# Patient Record
Sex: Male | Born: 1945 | Race: White | Hispanic: No | Marital: Married | Smoking: Former smoker
Health system: Southern US, Community
[De-identification: ages and names within clinical notes are randomized; demographics above are authoritative.]

## PROBLEM LIST (undated history)

## (undated) DIAGNOSIS — E7800 Pure hypercholesterolemia, unspecified: Secondary | ICD-10-CM

## (undated) DIAGNOSIS — R519 Headache, unspecified: Secondary | ICD-10-CM

## (undated) DIAGNOSIS — I10 Essential (primary) hypertension: Secondary | ICD-10-CM

## (undated) DIAGNOSIS — R0600 Dyspnea, unspecified: Secondary | ICD-10-CM

## (undated) DIAGNOSIS — M109 Gout, unspecified: Secondary | ICD-10-CM

## (undated) DIAGNOSIS — M1990 Unspecified osteoarthritis, unspecified site: Secondary | ICD-10-CM

## (undated) DIAGNOSIS — C4490 Unspecified malignant neoplasm of skin, unspecified: Secondary | ICD-10-CM

## (undated) DIAGNOSIS — T753XXA Motion sickness, initial encounter: Secondary | ICD-10-CM

## (undated) DIAGNOSIS — I509 Heart failure, unspecified: Secondary | ICD-10-CM

## (undated) DIAGNOSIS — H9190 Unspecified hearing loss, unspecified ear: Secondary | ICD-10-CM

## (undated) DIAGNOSIS — F419 Anxiety disorder, unspecified: Secondary | ICD-10-CM

## (undated) DIAGNOSIS — I499 Cardiac arrhythmia, unspecified: Secondary | ICD-10-CM

## (undated) DIAGNOSIS — E119 Type 2 diabetes mellitus without complications: Secondary | ICD-10-CM

## (undated) DIAGNOSIS — Z87442 Personal history of urinary calculi: Secondary | ICD-10-CM

## (undated) HISTORY — PX: SKIN CANCER EXCISION: SHX779

## (undated) HISTORY — PX: CARDIAC CATHETERIZATION: SHX172

## (undated) HISTORY — PX: AMPUTATION FINGER / THUMB: SUR24

---

## 2005-02-08 ENCOUNTER — Ambulatory Visit: Payer: Self-pay | Admitting: Pain Medicine

## 2008-05-09 ENCOUNTER — Ambulatory Visit: Payer: Self-pay | Admitting: Internal Medicine

## 2014-06-20 DIAGNOSIS — I10 Essential (primary) hypertension: Secondary | ICD-10-CM | POA: Diagnosis not present

## 2014-06-20 DIAGNOSIS — M1A09X0 Idiopathic chronic gout, multiple sites, without tophus (tophi): Secondary | ICD-10-CM | POA: Diagnosis not present

## 2014-06-20 DIAGNOSIS — M1990 Unspecified osteoarthritis, unspecified site: Secondary | ICD-10-CM | POA: Diagnosis not present

## 2014-06-20 DIAGNOSIS — E119 Type 2 diabetes mellitus without complications: Secondary | ICD-10-CM | POA: Diagnosis not present

## 2014-06-20 DIAGNOSIS — E780 Pure hypercholesterolemia: Secondary | ICD-10-CM | POA: Diagnosis not present

## 2014-06-29 DIAGNOSIS — D696 Thrombocytopenia, unspecified: Secondary | ICD-10-CM | POA: Diagnosis not present

## 2014-06-29 DIAGNOSIS — R748 Abnormal levels of other serum enzymes: Secondary | ICD-10-CM | POA: Diagnosis not present

## 2014-06-29 DIAGNOSIS — R945 Abnormal results of liver function studies: Secondary | ICD-10-CM | POA: Diagnosis not present

## 2014-07-12 ENCOUNTER — Ambulatory Visit: Payer: Self-pay | Admitting: Family Medicine

## 2014-07-12 DIAGNOSIS — J90 Pleural effusion, not elsewhere classified: Secondary | ICD-10-CM | POA: Diagnosis not present

## 2014-07-12 DIAGNOSIS — R188 Other ascites: Secondary | ICD-10-CM | POA: Diagnosis not present

## 2014-07-12 DIAGNOSIS — R748 Abnormal levels of other serum enzymes: Secondary | ICD-10-CM | POA: Diagnosis not present

## 2014-07-12 DIAGNOSIS — R945 Abnormal results of liver function studies: Secondary | ICD-10-CM | POA: Diagnosis not present

## 2014-07-13 ENCOUNTER — Ambulatory Visit
Admit: 2014-07-13 | Disposition: A | Discharge status: Home / Self Care | Payer: Self-pay | Attending: Hematology and Oncology | Admitting: Hematology and Oncology

## 2014-07-13 DIAGNOSIS — Z79899 Other long term (current) drug therapy: Secondary | ICD-10-CM | POA: Diagnosis not present

## 2014-07-13 DIAGNOSIS — Z720 Tobacco use: Secondary | ICD-10-CM | POA: Diagnosis not present

## 2014-07-13 DIAGNOSIS — Z114 Encounter for screening for human immunodeficiency virus [HIV]: Secondary | ICD-10-CM | POA: Diagnosis not present

## 2014-07-13 DIAGNOSIS — I10 Essential (primary) hypertension: Secondary | ICD-10-CM | POA: Diagnosis not present

## 2014-07-13 DIAGNOSIS — E780 Pure hypercholesterolemia: Secondary | ICD-10-CM | POA: Diagnosis not present

## 2014-07-13 DIAGNOSIS — N529 Male erectile dysfunction, unspecified: Secondary | ICD-10-CM | POA: Diagnosis not present

## 2014-07-13 DIAGNOSIS — M109 Gout, unspecified: Secondary | ICD-10-CM | POA: Diagnosis not present

## 2014-07-13 DIAGNOSIS — E119 Type 2 diabetes mellitus without complications: Secondary | ICD-10-CM | POA: Diagnosis not present

## 2014-07-13 DIAGNOSIS — R7989 Other specified abnormal findings of blood chemistry: Secondary | ICD-10-CM | POA: Diagnosis not present

## 2014-07-13 DIAGNOSIS — D6959 Other secondary thrombocytopenia: Secondary | ICD-10-CM | POA: Diagnosis not present

## 2014-07-15 ENCOUNTER — Ambulatory Visit: Payer: Self-pay | Admitting: Family Medicine

## 2014-07-15 DIAGNOSIS — J90 Pleural effusion, not elsewhere classified: Secondary | ICD-10-CM | POA: Diagnosis not present

## 2014-07-15 DIAGNOSIS — I81 Portal vein thrombosis: Secondary | ICD-10-CM | POA: Diagnosis not present

## 2014-07-15 DIAGNOSIS — R188 Other ascites: Secondary | ICD-10-CM | POA: Diagnosis not present

## 2014-07-15 DIAGNOSIS — R601 Generalized edema: Secondary | ICD-10-CM | POA: Diagnosis not present

## 2014-07-15 DIAGNOSIS — I517 Cardiomegaly: Secondary | ICD-10-CM | POA: Diagnosis not present

## 2014-07-15 DIAGNOSIS — I82890 Acute embolism and thrombosis of other specified veins: Secondary | ICD-10-CM | POA: Diagnosis not present

## 2014-07-26 DIAGNOSIS — E785 Hyperlipidemia, unspecified: Secondary | ICD-10-CM | POA: Diagnosis not present

## 2014-07-26 DIAGNOSIS — I10 Essential (primary) hypertension: Secondary | ICD-10-CM | POA: Diagnosis not present

## 2014-07-26 DIAGNOSIS — E119 Type 2 diabetes mellitus without complications: Secondary | ICD-10-CM | POA: Diagnosis not present

## 2014-07-27 DIAGNOSIS — I10 Essential (primary) hypertension: Secondary | ICD-10-CM | POA: Diagnosis not present

## 2014-07-27 DIAGNOSIS — E780 Pure hypercholesterolemia: Secondary | ICD-10-CM | POA: Diagnosis not present

## 2014-07-27 DIAGNOSIS — R0602 Shortness of breath: Secondary | ICD-10-CM | POA: Diagnosis not present

## 2014-07-27 DIAGNOSIS — E119 Type 2 diabetes mellitus without complications: Secondary | ICD-10-CM | POA: Diagnosis not present

## 2014-07-28 DIAGNOSIS — R188 Other ascites: Secondary | ICD-10-CM | POA: Diagnosis not present

## 2014-07-28 DIAGNOSIS — K766 Portal hypertension: Secondary | ICD-10-CM | POA: Diagnosis not present

## 2014-07-28 DIAGNOSIS — J948 Other specified pleural conditions: Secondary | ICD-10-CM | POA: Diagnosis not present

## 2014-07-28 DIAGNOSIS — J90 Pleural effusion, not elsewhere classified: Secondary | ICD-10-CM | POA: Diagnosis not present

## 2014-08-01 DIAGNOSIS — I5021 Acute systolic (congestive) heart failure: Secondary | ICD-10-CM | POA: Diagnosis not present

## 2014-08-01 DIAGNOSIS — R188 Other ascites: Secondary | ICD-10-CM | POA: Diagnosis not present

## 2014-08-01 DIAGNOSIS — K7460 Unspecified cirrhosis of liver: Secondary | ICD-10-CM | POA: Diagnosis not present

## 2014-08-01 DIAGNOSIS — K766 Portal hypertension: Secondary | ICD-10-CM | POA: Diagnosis not present

## 2014-08-01 DIAGNOSIS — I480 Paroxysmal atrial fibrillation: Secondary | ICD-10-CM | POA: Diagnosis not present

## 2014-08-02 DIAGNOSIS — R0602 Shortness of breath: Secondary | ICD-10-CM | POA: Diagnosis not present

## 2014-08-02 DIAGNOSIS — I5021 Acute systolic (congestive) heart failure: Secondary | ICD-10-CM | POA: Diagnosis not present

## 2014-08-02 DIAGNOSIS — I10 Essential (primary) hypertension: Secondary | ICD-10-CM | POA: Diagnosis not present

## 2014-08-02 DIAGNOSIS — I4891 Unspecified atrial fibrillation: Secondary | ICD-10-CM | POA: Diagnosis not present

## 2014-08-02 DIAGNOSIS — I480 Paroxysmal atrial fibrillation: Secondary | ICD-10-CM | POA: Diagnosis not present

## 2014-08-04 DIAGNOSIS — I480 Paroxysmal atrial fibrillation: Secondary | ICD-10-CM | POA: Diagnosis not present

## 2014-08-04 DIAGNOSIS — E782 Mixed hyperlipidemia: Secondary | ICD-10-CM | POA: Diagnosis not present

## 2014-08-04 DIAGNOSIS — I10 Essential (primary) hypertension: Secondary | ICD-10-CM | POA: Diagnosis not present

## 2014-08-04 DIAGNOSIS — N189 Chronic kidney disease, unspecified: Secondary | ICD-10-CM | POA: Diagnosis not present

## 2014-08-04 DIAGNOSIS — R600 Localized edema: Secondary | ICD-10-CM | POA: Diagnosis not present

## 2014-08-08 DIAGNOSIS — I4892 Unspecified atrial flutter: Secondary | ICD-10-CM | POA: Diagnosis not present

## 2014-08-08 DIAGNOSIS — I480 Paroxysmal atrial fibrillation: Secondary | ICD-10-CM | POA: Diagnosis not present

## 2014-08-08 DIAGNOSIS — K766 Portal hypertension: Secondary | ICD-10-CM | POA: Diagnosis not present

## 2014-08-08 DIAGNOSIS — I10 Essential (primary) hypertension: Secondary | ICD-10-CM | POA: Diagnosis not present

## 2014-08-08 DIAGNOSIS — I5021 Acute systolic (congestive) heart failure: Secondary | ICD-10-CM | POA: Diagnosis not present

## 2014-08-10 DIAGNOSIS — K766 Portal hypertension: Secondary | ICD-10-CM | POA: Diagnosis not present

## 2014-08-10 DIAGNOSIS — I5021 Acute systolic (congestive) heart failure: Secondary | ICD-10-CM | POA: Diagnosis not present

## 2014-08-10 DIAGNOSIS — F418 Other specified anxiety disorders: Secondary | ICD-10-CM | POA: Diagnosis not present

## 2014-08-10 DIAGNOSIS — E119 Type 2 diabetes mellitus without complications: Secondary | ICD-10-CM | POA: Diagnosis not present

## 2014-08-12 ENCOUNTER — Ambulatory Visit
Admit: 2014-08-12 | Disposition: A | Discharge status: Home / Self Care | Payer: Self-pay | Attending: Hematology and Oncology | Admitting: Hematology and Oncology

## 2014-08-17 DIAGNOSIS — R600 Localized edema: Secondary | ICD-10-CM | POA: Diagnosis not present

## 2014-08-17 DIAGNOSIS — N189 Chronic kidney disease, unspecified: Secondary | ICD-10-CM | POA: Diagnosis not present

## 2014-08-17 DIAGNOSIS — I480 Paroxysmal atrial fibrillation: Secondary | ICD-10-CM | POA: Diagnosis not present

## 2014-08-17 DIAGNOSIS — I4892 Unspecified atrial flutter: Secondary | ICD-10-CM | POA: Diagnosis not present

## 2014-08-17 DIAGNOSIS — I5021 Acute systolic (congestive) heart failure: Secondary | ICD-10-CM | POA: Diagnosis not present

## 2014-08-23 ENCOUNTER — Ambulatory Visit
Admit: 2014-08-23 | Disposition: A | Discharge status: Home / Self Care | Payer: Self-pay | Attending: Family Medicine | Admitting: Family Medicine

## 2014-08-23 DIAGNOSIS — E119 Type 2 diabetes mellitus without complications: Secondary | ICD-10-CM | POA: Diagnosis not present

## 2014-08-25 DIAGNOSIS — I5021 Acute systolic (congestive) heart failure: Secondary | ICD-10-CM | POA: Diagnosis not present

## 2014-08-25 DIAGNOSIS — I4892 Unspecified atrial flutter: Secondary | ICD-10-CM | POA: Diagnosis not present

## 2014-08-25 DIAGNOSIS — I10 Essential (primary) hypertension: Secondary | ICD-10-CM | POA: Diagnosis not present

## 2014-08-29 ENCOUNTER — Ambulatory Visit
Admit: 2014-08-29 | Disposition: A | Discharge status: Home / Self Care | Payer: Self-pay | Attending: Internal Medicine | Admitting: Internal Medicine

## 2014-08-29 DIAGNOSIS — E119 Type 2 diabetes mellitus without complications: Secondary | ICD-10-CM | POA: Diagnosis not present

## 2014-08-29 DIAGNOSIS — I509 Heart failure, unspecified: Secondary | ICD-10-CM | POA: Diagnosis not present

## 2014-08-29 DIAGNOSIS — Z79899 Other long term (current) drug therapy: Secondary | ICD-10-CM | POA: Diagnosis not present

## 2014-08-29 DIAGNOSIS — M109 Gout, unspecified: Secondary | ICD-10-CM | POA: Diagnosis not present

## 2014-08-29 DIAGNOSIS — I10 Essential (primary) hypertension: Secondary | ICD-10-CM | POA: Diagnosis not present

## 2014-08-29 DIAGNOSIS — Z87891 Personal history of nicotine dependence: Secondary | ICD-10-CM | POA: Diagnosis not present

## 2014-08-29 DIAGNOSIS — I420 Dilated cardiomyopathy: Secondary | ICD-10-CM | POA: Diagnosis not present

## 2014-08-29 DIAGNOSIS — I272 Other secondary pulmonary hypertension: Secondary | ICD-10-CM | POA: Diagnosis not present

## 2014-08-29 DIAGNOSIS — I2510 Atherosclerotic heart disease of native coronary artery without angina pectoris: Secondary | ICD-10-CM | POA: Diagnosis not present

## 2014-08-29 DIAGNOSIS — I5021 Acute systolic (congestive) heart failure: Secondary | ICD-10-CM | POA: Diagnosis not present

## 2014-08-29 DIAGNOSIS — I4892 Unspecified atrial flutter: Secondary | ICD-10-CM | POA: Diagnosis not present

## 2014-09-06 DIAGNOSIS — K766 Portal hypertension: Secondary | ICD-10-CM | POA: Diagnosis not present

## 2014-09-06 DIAGNOSIS — E782 Mixed hyperlipidemia: Secondary | ICD-10-CM | POA: Diagnosis not present

## 2014-09-06 DIAGNOSIS — I5021 Acute systolic (congestive) heart failure: Secondary | ICD-10-CM | POA: Diagnosis not present

## 2014-09-14 DIAGNOSIS — I5021 Acute systolic (congestive) heart failure: Secondary | ICD-10-CM | POA: Diagnosis not present

## 2014-09-14 DIAGNOSIS — K766 Portal hypertension: Secondary | ICD-10-CM | POA: Diagnosis not present

## 2014-09-14 DIAGNOSIS — K7460 Unspecified cirrhosis of liver: Secondary | ICD-10-CM | POA: Diagnosis not present

## 2014-09-14 DIAGNOSIS — F418 Other specified anxiety disorders: Secondary | ICD-10-CM | POA: Diagnosis not present

## 2014-09-15 DIAGNOSIS — I5043 Acute on chronic combined systolic (congestive) and diastolic (congestive) heart failure: Secondary | ICD-10-CM | POA: Diagnosis not present

## 2014-09-15 DIAGNOSIS — I517 Cardiomegaly: Secondary | ICD-10-CM | POA: Diagnosis not present

## 2014-09-15 DIAGNOSIS — J90 Pleural effusion, not elsewhere classified: Secondary | ICD-10-CM | POA: Diagnosis not present

## 2014-09-15 DIAGNOSIS — R188 Other ascites: Secondary | ICD-10-CM | POA: Diagnosis not present

## 2014-09-15 DIAGNOSIS — I480 Paroxysmal atrial fibrillation: Secondary | ICD-10-CM | POA: Diagnosis not present

## 2014-09-20 DIAGNOSIS — I4891 Unspecified atrial fibrillation: Secondary | ICD-10-CM | POA: Diagnosis not present

## 2014-09-20 DIAGNOSIS — I4892 Unspecified atrial flutter: Secondary | ICD-10-CM | POA: Diagnosis not present

## 2014-09-22 DIAGNOSIS — I5022 Chronic systolic (congestive) heart failure: Secondary | ICD-10-CM | POA: Diagnosis not present

## 2014-09-22 DIAGNOSIS — I480 Paroxysmal atrial fibrillation: Secondary | ICD-10-CM | POA: Diagnosis not present

## 2014-09-22 DIAGNOSIS — I10 Essential (primary) hypertension: Secondary | ICD-10-CM | POA: Diagnosis not present

## 2014-09-22 DIAGNOSIS — I4892 Unspecified atrial flutter: Secondary | ICD-10-CM | POA: Diagnosis not present

## 2014-09-28 ENCOUNTER — Telehealth: Payer: Self-pay | Admitting: *Deleted

## 2014-09-28 NOTE — Telephone Encounter (Signed)
Phone call to follow up with patient. He was not feeling well during initial visit and was scheduled for several procedures for CHF. Pt reports he is doing a little better and his blood sugar was 98 mg/dL this morning. Informed him that he could return for classes or 1:1 appt in the future if he is able to attend. Also informed him that he could call for any questions.

## 2014-09-29 ENCOUNTER — Encounter: Payer: Self-pay | Admitting: Emergency Medicine

## 2014-09-29 ENCOUNTER — Ambulatory Visit
Admission: EM | Admit: 2014-09-29 | Discharge: 2014-09-29 | Discharge status: Against Medical Advice | Payer: Medicare Other | Attending: Family Medicine | Admitting: Family Medicine

## 2014-09-29 DIAGNOSIS — R000 Tachycardia, unspecified: Secondary | ICD-10-CM

## 2014-09-29 DIAGNOSIS — W19XXXA Unspecified fall, initial encounter: Secondary | ICD-10-CM

## 2014-09-29 DIAGNOSIS — S0181XA Laceration without foreign body of other part of head, initial encounter: Secondary | ICD-10-CM | POA: Diagnosis not present

## 2014-09-29 DIAGNOSIS — I959 Hypotension, unspecified: Secondary | ICD-10-CM | POA: Diagnosis not present

## 2014-09-29 HISTORY — DX: Heart failure, unspecified: I50.9

## 2014-09-29 HISTORY — DX: Type 2 diabetes mellitus without complications: E11.9

## 2014-09-29 HISTORY — DX: Essential (primary) hypertension: I10

## 2014-09-29 NOTE — ED Provider Notes (Signed)
CSN: [id number]     Arrival date & time 09/29/14  [id number] History   First MD Initiated Contact with Patient 09/29/14 [phone]     Chief Complaint  Patient presents with  . Laceration   (Consider location/radiation/quality/duration/timing/severity/associated sxs/prior Treatment) HPI         69 year old male presents for evaluation after a fall. He fell 2 days ago. He was standing when he bent over to try to pick up his cell phone and fell. He thinks he probably hit his face on something but he is not sure. He did not lose consciousness. Now has some pain on his face beside his left eye. No headache or pain with eye movements. He has a history of heart failure and diabetes, he does not know any of the medications he is on besides benazepril. He does not know if he is on a blood thinner. He denies any chest pain, shortness of breath, NVD, abdominal pain. He has leg swelling bilaterally, he says that is improving with whatever medication his cardiologist gave him.  Past Medical History  Diagnosis Date  . CHF (congestive heart failure)   . Hypertension   . Diabetes mellitus without complication    No past surgical history on file. No family history on file. History  Substance Use Topics  . Smoking status: Former Research scientist (life sciences)  . Smokeless tobacco: Never Used  . Alcohol Use: No    Review of Systems  Constitutional: Negative for fever, chills and fatigue.  HENT: Negative for sore throat.   Eyes: Negative for visual disturbance.  Respiratory: Negative for cough and shortness of breath.   Cardiovascular: Positive for leg swelling. Negative for chest pain and palpitations.  Gastrointestinal: Negative for nausea, vomiting, abdominal pain, diarrhea and constipation.  Genitourinary: Negative for dysuria, urgency, frequency and hematuria.  Musculoskeletal: Negative for myalgias, arthralgias, neck pain and neck stiffness.  Skin: Positive for wound. Negative for rash.  Neurological: Negative for dizziness,  weakness and light-headedness.  All other systems reviewed and are negative.   Allergies  Review of patient's allergies indicates no known allergies.  Home Medications   Prior to Admission medications   Medication Sig Start Date End Date Taking? Authorizing Provider  benazepril-hydrochlorthiazide (LOTENSIN HCT) 20-25 MG per tablet Take 1 tablet by mouth daily.   Yes Historical Provider, MD  furosemide (LASIX) 20 MG tablet Take 20 mg by mouth daily.   Yes Historical Provider, MD   BP 88/62 mmHg  Pulse 113  Temp(Src) 97.7 F (36.5 C) (Oral)  Resp 16  Ht 6' (1.829 m)  Wt 170 lb (77.111 kg)  BMI 23.05 kg/m2  SpO2 98% Physical Exam  Constitutional: He is oriented to person, place, and time. He appears well-developed and well-nourished. No distress.  HENT:  Head: Normocephalic. Head is with laceration. Head is without raccoon's eyes and without Battle's sign.    Right Ear: External ear normal.  Left Ear: External ear normal.  Nose: Nose normal.  Mouth/Throat: Oropharynx is clear and moist. No oropharyngeal exudate.  Eyes: Conjunctivae and EOM are normal. Pupils are equal, round, and reactive to light.  Orbital rim is nontender  Neck: Normal range of motion. Neck supple. No JVD present. No tracheal deviation present.  Cardiovascular: Regular rhythm, normal heart sounds and intact distal pulses.  Tachycardia present.  Exam reveals no gallop and no friction rub.   No murmur heard. Pulses:      Radial pulses are 2+ on the right side, and 2+ on the left side.  Extremities are cool, capillary refill is delayed  Pulmonary/Chest: Effort normal and breath sounds normal. No respiratory distress. He has no wheezes. He has no rales.  Abdominal: Soft. He exhibits no distension and no mass. There is no tenderness. There is no rebound and no guarding.  Lymphadenopathy:    He has no cervical adenopathy.  Neurological: He is alert and oriented to person, place, and time. He has normal strength.  No cranial nerve deficit or sensory deficit. He exhibits normal muscle tone. He displays a negative Romberg sign. Coordination and gait normal.  Skin: Skin is warm and dry. No rash noted. He is not diaphoretic.  Psychiatric: He has a normal mood and affect. Judgment normal.  Nursing note and vitals reviewed.   ED Course  Procedures (including critical care time) Labs Review Labs Reviewed - No data to display  Imaging Review No results found.   MDM   1. Laceration of face, initial encounter   2. Hypotension, unspecified hypotension type   3. Tachycardia   4. Fall, initial encounter    He has a fall, now presents with hypotension and tachycardia. I expressed the patient that I'm concerned that he has internal bleeding somewhere, also he has multiple comorbidities that he knows nothing about, he is unsure of what diagnosis he has and what medication he is taking. Explained to him that given his abnormal vital signs, we need to transfer him to the emergency department for further evaluation. He declines this. I told him in detail what could happen if he has internal bleeding or if he has had an MI or some other cardiovascular event causing his symptoms. I told him that without proper treatment his condition could worsen and he could die, he accepts this risk. He says he wants to go to his cardiologist first. I offered to call his cardiologist for him and get their recommendations, he then became evasive and did not want to tell me the name of his cardiologist. He agrees to sign out [place].    Liam Graham, PA-C 09/29/14 [phone]

## 2014-09-29 NOTE — Discharge Instructions (Signed)
You have chosen to leave our facility [facility] today and you are not accepting our treatment. You are welcome to come back to our facility at any time. However I would encourage you to call 911 and be seen at the nearest emergency department.

## 2014-09-29 NOTE — ED Notes (Signed)
Patient states he is taking 6 medications but cannot think of all the names.

## 2014-09-29 NOTE — ED Notes (Signed)
Patient reports a fall yesterday or the day before. He tripped on a cord and fell face forward onto his dresses. C/o a cut near his left eye. No loss of consciousness. No headache, dizziness, changes in vision.

## 2014-10-13 ENCOUNTER — Encounter: Payer: Self-pay | Admitting: *Deleted

## 2014-10-14 ENCOUNTER — Ambulatory Visit: Payer: Medicare Other | Admitting: Certified Registered"

## 2014-10-14 ENCOUNTER — Encounter: Payer: Self-pay | Admitting: *Deleted

## 2014-10-14 ENCOUNTER — Ambulatory Visit
Admission: RE | Admit: 2014-10-14 | Discharge: 2014-10-14 | Disposition: A | Discharge status: Home / Self Care | Payer: Medicare Other | Source: Ambulatory Visit | Attending: Gastroenterology | Admitting: Gastroenterology

## 2014-10-14 ENCOUNTER — Encounter
Admission: RE | Disposition: A | Discharge status: Home / Self Care | Payer: Self-pay | Source: Ambulatory Visit | Attending: Gastroenterology

## 2014-10-14 DIAGNOSIS — R188 Other ascites: Secondary | ICD-10-CM | POA: Diagnosis not present

## 2014-10-14 DIAGNOSIS — E119 Type 2 diabetes mellitus without complications: Secondary | ICD-10-CM | POA: Insufficient documentation

## 2014-10-14 DIAGNOSIS — K7460 Unspecified cirrhosis of liver: Secondary | ICD-10-CM | POA: Insufficient documentation

## 2014-10-14 DIAGNOSIS — I509 Heart failure, unspecified: Secondary | ICD-10-CM | POA: Insufficient documentation

## 2014-10-14 DIAGNOSIS — I10 Essential (primary) hypertension: Secondary | ICD-10-CM | POA: Insufficient documentation

## 2014-10-14 DIAGNOSIS — M1990 Unspecified osteoarthritis, unspecified site: Secondary | ICD-10-CM | POA: Insufficient documentation

## 2014-10-14 DIAGNOSIS — I959 Hypotension, unspecified: Secondary | ICD-10-CM

## 2014-10-14 DIAGNOSIS — Z79899 Other long term (current) drug therapy: Secondary | ICD-10-CM | POA: Insufficient documentation

## 2014-10-14 DIAGNOSIS — Z87891 Personal history of nicotine dependence: Secondary | ICD-10-CM | POA: Diagnosis not present

## 2014-10-14 DIAGNOSIS — K766 Portal hypertension: Secondary | ICD-10-CM | POA: Diagnosis not present

## 2014-10-14 HISTORY — DX: Cardiac arrhythmia, unspecified: I49.9

## 2014-10-14 HISTORY — DX: Unspecified osteoarthritis, unspecified site: M19.90

## 2014-10-14 HISTORY — PX: ESOPHAGOGASTRODUODENOSCOPY: SHX5428

## 2014-10-14 LAB — CBC
HCT: 44.1 % (ref 40.0–52.0)
HEMOGLOBIN: 14.4 g/dL (ref 13.0–18.0)
MCH: 33 pg (ref 26.0–34.0)
MCHC: 32.7 g/dL (ref 32.0–36.0)
MCV: 100.8 fL — AB (ref 80.0–100.0)
Platelets: 139 10*3/uL — ABNORMAL LOW (ref 150–440)
RBC: 4.38 MIL/uL — ABNORMAL LOW (ref 4.40–5.90)
RDW: 18.1 % — ABNORMAL HIGH (ref 11.5–14.5)
WBC: 7.3 10*3/uL (ref 3.8–10.6)

## 2014-10-14 LAB — BASIC METABOLIC PANEL
ANION GAP: 6 (ref 5–15)
BUN: 31 mg/dL — AB (ref 6–20)
CO2: 27 mmol/L (ref 22–32)
CREATININE: 1.41 mg/dL — AB (ref 0.61–1.24)
Calcium: 9.1 mg/dL (ref 8.9–10.3)
Chloride: 104 mmol/L (ref 101–111)
GFR calc Af Amer: 58 mL/min — ABNORMAL LOW (ref >60)
GFR, EST NON AFRICAN AMERICAN: 50 mL/min — AB (ref >60)
Glucose, Bld: 111 mg/dL — ABNORMAL HIGH (ref 65–99)
Potassium: 5.2 mmol/L — ABNORMAL HIGH (ref 3.5–5.1)
Sodium: 137 mmol/L (ref 135–145)

## 2014-10-14 LAB — GLUCOSE, CAPILLARY: GLUCOSE-CAPILLARY: 113 mg/dL — AB (ref 65–99)

## 2014-10-14 SURGERY — EGD (ESOPHAGOGASTRODUODENOSCOPY)
Anesthesia: General

## 2014-10-14 MED ORDER — SODIUM CHLORIDE 0.9 % IV SOLN
INTRAVENOUS | Status: DC
  Administered 2014-10-14: 1000 mL via INTRAVENOUS

## 2014-10-14 MED ORDER — PROPOFOL 10 MG/ML IV BOLUS
INTRAVENOUS | Status: DC | PRN
  Administered 2014-10-14 (×2): 50 mg via INTRAVENOUS

## 2014-10-14 MED ORDER — PROPOFOL INFUSION 10 MG/ML OPTIME
INTRAVENOUS | Status: DC | PRN
Start: 2014-10-14 — End: 2014-10-14
  Administered 2014-10-14: 120 ug/kg/min via INTRAVENOUS

## 2014-10-14 MED ORDER — PHENYLEPHRINE HCL 10 MG/ML IJ SOLN
INTRAMUSCULAR | Status: DC | PRN
  Administered 2014-10-14 (×2): 50 ug via INTRAVENOUS

## 2014-10-14 MED ORDER — LIDOCAINE HCL (CARDIAC) 20 MG/ML IV SOLN
INTRAVENOUS | Status: DC | PRN
  Administered 2014-10-14: 50 mg via INTRAVENOUS

## 2014-10-14 NOTE — Op Note (Signed)
[facility] Gastroenterology Patient Name: Matthew Ali Procedure Date: 10/14/2014 8:48 AM MRN: [id number] Account #: [id number] Date of Birth: Oct 06, 1945 Admit Type: Outpatient Age: 69 Room: [facility] ENDO ROOM 1 Gender: Male Note Status: Finalized Procedure:         Upper GI endoscopy Indications:       Portal hypertension rule out esophageal varices Patient Profile:   This is a 69 year old male. Providers:         Gerrit Heck. Rayann Heman, MD Referring MD:      Sofie Hartigan (Referring MD), Corey Skains (int                     Med), MD (Referring MD) Medicines:         Propofol per Anesthesia Complications:     No immediate complications. Procedure:         Pre-Anesthesia Assessment:                    - Prior to the procedure, a History and Physical was                     performed, and patient medications and allergies were                     reviewed. The patient is competent. The risks and benefits                     of the procedure and the sedation options and risks were                     discussed with the patient. All questions were answered                     and informed consent was obtained. Patient identification                     and proposed procedure were verified by the physician and                     the nurse in the pre-procedure area. Mental Status                     Examination: alert and oriented. Airway Examination:                     normal oropharyngeal airway and neck mobility. Respiratory                     Examination: clear to auscultation. CV Examination: RRR,                     no murmurs, no S3 or S4. Prophylactic Antibiotics: The                     patient does not require prophylactic antibiotics. Prior                     Anticoagulants: The patient has taken no previous                     anticoagulant or antiplatelet agents. ASA Grade                     Assessment: III - A patient with  severe systemic disease.                    After reviewing the risks and benefits, the patient was                     deemed in satisfactory condition to undergo the procedure.                     The anesthesia plan was to use monitored anesthesia care                     ([place]). Immediately prior to administration of medications,                     the patient was re-assessed for adequacy to receive                     sedatives. The heart rate, respiratory rate, oxygen                     saturations, blood pressure, adequacy of pulmonary                     ventilation, and response to care were monitored                     throughout the procedure. The physical status of the                     patient was re-assessed after the procedure.                    After obtaining informed consent, the endoscope was passed                     under direct vision. Throughout the procedure, the                     patient's blood pressure, pulse, and oxygen saturations                     were monitored continuously. The Olympus GIF-160 endoscope                     (S#. [id number]) was introduced through the mouth, and                     advanced to the second part of duodenum. The upper GI                     endoscopy was accomplished without difficulty. The patient                     tolerated the procedure well. Findings:      The esophagus was normal. No varices.      The stomach was normal. No portal HTN gastropathy.      The examined duodenum was normal. Impression:        - Normal esophagus.                    - Normal stomach.                    - Normal examined duodenum.                    -  No specimens collected. Recommendation:    - Observe patient in GI recovery unit.                    - Resume regular diet.                    - Continue present medications.                    - Return to liver clinic.                    - The findings and recommendations were discussed with the                      patient.                    - The findings and recommendations were discussed with the                     patient's family. Procedure Code(s): --- Professional ---                    [phone], Esophagogastroduodenoscopy, flexible, transoral;                     diagnostic, including collection of specimen(s) by                     brushing or washing, when performed (separate procedure) CPT copyright 2014 American Medical Association. All rights reserved. The codes documented in this report are preliminary and upon coder review may  be revised to meet current compliance requirements. Mellody Life, MD 10/14/2014 9:11:45 AM This report has been signed electronically. Number of Addenda: 0 Note Initiated On: 10/14/2014 8:48 AM      [facility]

## 2014-10-14 NOTE — Anesthesia Postprocedure Evaluation (Signed)
  Anesthesia Post-op Note  Patient: Matthew Ali  Procedure(s) Performed: Procedure(s): ESOPHAGOGASTRODUODENOSCOPY (EGD) (N/A)  Anesthesia type:General  Patient location: PACU  Post pain: Pain level controlled  Post assessment: Post-op Vital signs reviewed, Patient's Cardiovascular Status Stable, Respiratory Function Stable, Patent Airway and No signs of Nausea or vomiting  Post vital signs: Reviewed and stable  Last Vitals:  Filed Vitals:   10/14/14 0920  BP: 98/80  Pulse: 111  Temp:   Resp: 23    Level of consciousness: awake, alert  and patient cooperative  Complications: No apparent anesthesia complications

## 2014-10-14 NOTE — Anesthesia Postprocedure Evaluation (Signed)
  Anesthesia Post-op Note  Patient: Matthew Ali  Procedure(s) Performed: Procedure(s): ESOPHAGOGASTRODUODENOSCOPY (EGD) (N/A)  Anesthesia type:General  Patient location: PACU  Post pain: Pain level controlled  Post assessment: Post-op Vital signs reviewed, Patient's Cardiovascular Status Stable, Respiratory Function Stable, Patent Airway and No signs of Nausea or vomiting  Post vital signs: Reviewed and stable  Last Vitals:  Filed Vitals:   10/14/14 0915  BP:   Pulse:   Temp: 35.8 C  Resp:     Level of consciousness: awake, alert  and patient cooperative  Complications: No apparent anesthesia complications

## 2014-10-14 NOTE — Transfer of Care (Signed)
Immediate Anesthesia Transfer of Care Note  Patient: Matthew Ali  Procedure(s) Performed: Procedure(s): ESOPHAGOGASTRODUODENOSCOPY (EGD) (N/A)  Patient Location: Endoscopy Unit  Anesthesia Type:General  Level of Consciousness: awake and alert   Airway & Oxygen Therapy: Patient Spontanous Breathing and Patient connected to nasal cannula oxygen  Post-op Assessment: Report given to RN  Post vital signs: Reviewed  Last Vitals:  Filed Vitals:   10/14/14 0913  BP: 104/78  Pulse: 107  Temp: 35.9 C  Resp: 20    Complications: No apparent anesthesia complications

## 2014-10-14 NOTE — H&P (Signed)
  Primary Care Physician:  [facility], Chrissie Noa, MD  Pre-Procedure History & Physical: HPI:  Matthew Ali is a 69 y.o. male is here for an endoscopy.   Past Medical History  Diagnosis Date  . CHF (congestive heart failure)   . Hypertension   . Diabetes mellitus without complication   . Arthritis   . Dysrhythmia     History reviewed. No pertinent past surgical history.  Prior to Admission medications   Medication Sig Start Date End Date Taking? Authorizing Provider  allopurinol (ZYLOPRIM) 100 MG tablet Take 1 tablet by mouth daily. 09/29/14  Yes Historical Provider, MD  benazepril (LOTENSIN) 10 MG tablet Take 1 tablet by mouth daily. 08/02/14 08/02/15 Yes Historical Provider, MD  clonazePAM (KLONOPIN) 0.5 MG tablet Take 1 tablet by mouth 2 (two) times daily as needed. Anxiety 09/15/14  Yes Historical Provider, MD  furosemide (LASIX) 20 MG tablet Take 20 mg by mouth daily.   Yes Historical Provider, MD  meloxicam (MOBIC) 15 MG tablet Take 1 tablet by mouth daily. 09/26/14  Yes Historical Provider, MD  metoprolol (LOPRESSOR) 100 MG tablet Take 1 tablet by mouth 2 (two) times daily.  09/06/14 09/06/15 Yes Historical Provider, MD  spironolactone (ALDACTONE) 100 MG tablet Take 0.5 tablets by mouth daily. 08/31/14  Yes Historical Provider, MD    Allergies as of 10/05/2014  . (No Known Allergies)    History reviewed. No pertinent family history.  History   Social History  . Marital Status: Married    Spouse Name: N/A  . Number of Children: N/A  . Years of Education: N/A   Occupational History  . Not on file.   Social History Main Topics  . Smoking status: Former Research scientist (life sciences)  . Smokeless tobacco: Never Used  . Alcohol Use: No  . Drug Use: No  . Sexual Activity: Not on file   Other Topics Concern  . Not on file   Social History Narrative     Physical Exam: BP 89/65 mmHg  Pulse 110  Temp(Src) 96.9 F (36.1 C) (Tympanic)  Resp 20  Ht 6' (1.829 m)  Wt 160 lb (72.576 kg)  BMI  21.70 kg/m2  SpO2 100% General:   Alert,  pleasant and cooperative in NAD Head:  Normocephalic and atraumatic. Neck:  Supple; no masses or thyromegaly. Lungs: decreased breath sounds bilat base   Heart:  Slightly tach, reg rhythm. Abdomen:  Mild distension,  Normal bowel sounds, without guarding, and without rebound.   Neurologic:  Alert and  oriented x4;  grossly normal neurologically.  Impression/Plan: [name] is here for an endoscopy to be performed for variceal screening  Risks, benefits, limitations, and alternatives regarding  endoscopy have been reviewed with the patient.  Questions have been answered.  All parties agreeable.   Josefine Class, MD  10/14/2014, 8:49 AM

## 2014-10-14 NOTE — Anesthesia Preprocedure Evaluation (Signed)
Anesthesia Evaluation   Patient awake    Reviewed: Allergy & Precautions, NPO status , Patient's Chart, lab work & pertinent test results  History of Anesthesia Complications Negative for: history of anesthetic complications  Airway Mallampati: III       Dental no notable dental hx.    Pulmonary former smoker,    Pulmonary exam normal       Cardiovascular hypertension, Pt. on home beta blockers +CHF + dysrhythmias Rate:Tachycardia     Neuro/Psych negative neurological ROS  negative psych ROS   GI/Hepatic negative GI ROS, (+) Cirrhosis -      ,   Endo/Other  diabetes, Type 2, Oral Hypoglycemic Agents  Renal/GU negative Renal ROS  negative genitourinary   Musculoskeletal  (+) Arthritis -, Osteoarthritis,    Abdominal Normal abdominal exam  (+)   Peds negative pediatric ROS (+)  Hematology negative hematology ROS (+)   Anesthesia Other Findings   Reproductive/Obstetrics                             Anesthesia Physical Anesthesia Plan  ASA: III  Anesthesia Plan: General   Post-op Pain Management:    Induction: Intravenous  Airway Management Planned:   Additional Equipment:   Intra-op Plan:   Post-operative Plan:   Informed Consent: I have reviewed the patients History and Physical, chart, labs and discussed the procedure including the risks, benefits and alternatives for the proposed anesthesia with the patient or authorized representative who has indicated his/her understanding and acceptance.     Plan Discussed with: CRNA  Anesthesia Plan Comments:         Anesthesia Quick Evaluation

## 2014-10-19 ENCOUNTER — Encounter: Payer: Self-pay | Admitting: Gastroenterology

## 2014-10-20 DIAGNOSIS — I5021 Acute systolic (congestive) heart failure: Secondary | ICD-10-CM | POA: Diagnosis not present

## 2014-10-20 DIAGNOSIS — I480 Paroxysmal atrial fibrillation: Secondary | ICD-10-CM | POA: Diagnosis not present

## 2014-10-20 DIAGNOSIS — I471 Supraventricular tachycardia: Secondary | ICD-10-CM | POA: Diagnosis not present

## 2014-10-20 DIAGNOSIS — I5022 Chronic systolic (congestive) heart failure: Secondary | ICD-10-CM | POA: Diagnosis not present

## 2014-10-24 DIAGNOSIS — R188 Other ascites: Secondary | ICD-10-CM | POA: Diagnosis not present

## 2014-11-10 DIAGNOSIS — R351 Nocturia: Secondary | ICD-10-CM | POA: Diagnosis not present

## 2014-11-10 DIAGNOSIS — K7460 Unspecified cirrhosis of liver: Secondary | ICD-10-CM | POA: Diagnosis not present

## 2014-11-10 DIAGNOSIS — F418 Other specified anxiety disorders: Secondary | ICD-10-CM | POA: Diagnosis not present

## 2014-11-10 DIAGNOSIS — K769 Liver disease, unspecified: Secondary | ICD-10-CM | POA: Diagnosis not present

## 2014-11-10 DIAGNOSIS — I5022 Chronic systolic (congestive) heart failure: Secondary | ICD-10-CM | POA: Diagnosis not present

## 2014-11-10 DIAGNOSIS — Z125 Encounter for screening for malignant neoplasm of prostate: Secondary | ICD-10-CM | POA: Diagnosis not present

## 2014-12-01 DIAGNOSIS — I4892 Unspecified atrial flutter: Secondary | ICD-10-CM | POA: Diagnosis not present

## 2014-12-01 DIAGNOSIS — I5022 Chronic systolic (congestive) heart failure: Secondary | ICD-10-CM | POA: Diagnosis not present

## 2014-12-01 DIAGNOSIS — I480 Paroxysmal atrial fibrillation: Secondary | ICD-10-CM | POA: Diagnosis not present

## 2014-12-01 DIAGNOSIS — E782 Mixed hyperlipidemia: Secondary | ICD-10-CM | POA: Diagnosis not present

## 2014-12-07 DIAGNOSIS — Z5181 Encounter for therapeutic drug level monitoring: Secondary | ICD-10-CM | POA: Diagnosis not present

## 2014-12-07 DIAGNOSIS — Z79899 Other long term (current) drug therapy: Secondary | ICD-10-CM | POA: Diagnosis not present

## 2014-12-12 DIAGNOSIS — N390 Urinary tract infection, site not specified: Secondary | ICD-10-CM | POA: Diagnosis not present

## 2014-12-13 DIAGNOSIS — N390 Urinary tract infection, site not specified: Secondary | ICD-10-CM | POA: Diagnosis not present

## 2014-12-19 DIAGNOSIS — I10 Essential (primary) hypertension: Secondary | ICD-10-CM | POA: Diagnosis not present

## 2014-12-19 DIAGNOSIS — E119 Type 2 diabetes mellitus without complications: Secondary | ICD-10-CM | POA: Diagnosis not present

## 2014-12-19 DIAGNOSIS — E780 Pure hypercholesterolemia: Secondary | ICD-10-CM | POA: Diagnosis not present

## 2014-12-19 DIAGNOSIS — M1A09X0 Idiopathic chronic gout, multiple sites, without tophus (tophi): Secondary | ICD-10-CM | POA: Diagnosis not present

## 2014-12-19 DIAGNOSIS — N390 Urinary tract infection, site not specified: Secondary | ICD-10-CM | POA: Diagnosis not present

## 2014-12-19 DIAGNOSIS — M1990 Unspecified osteoarthritis, unspecified site: Secondary | ICD-10-CM | POA: Diagnosis not present

## 2015-02-08 DIAGNOSIS — Z23 Encounter for immunization: Secondary | ICD-10-CM | POA: Diagnosis not present

## 2015-02-09 DIAGNOSIS — I5023 Acute on chronic systolic (congestive) heart failure: Secondary | ICD-10-CM | POA: Diagnosis not present

## 2015-02-09 DIAGNOSIS — I471 Supraventricular tachycardia: Secondary | ICD-10-CM | POA: Diagnosis not present

## 2015-02-09 DIAGNOSIS — I5022 Chronic systolic (congestive) heart failure: Secondary | ICD-10-CM | POA: Diagnosis not present

## 2015-02-09 DIAGNOSIS — I480 Paroxysmal atrial fibrillation: Secondary | ICD-10-CM | POA: Diagnosis not present

## 2015-02-16 DIAGNOSIS — K7460 Unspecified cirrhosis of liver: Secondary | ICD-10-CM | POA: Diagnosis not present

## 2015-02-21 DIAGNOSIS — I480 Paroxysmal atrial fibrillation: Secondary | ICD-10-CM | POA: Diagnosis not present

## 2015-02-21 DIAGNOSIS — R600 Localized edema: Secondary | ICD-10-CM | POA: Diagnosis not present

## 2015-02-21 DIAGNOSIS — I471 Supraventricular tachycardia: Secondary | ICD-10-CM | POA: Diagnosis not present

## 2015-02-21 DIAGNOSIS — I5023 Acute on chronic systolic (congestive) heart failure: Secondary | ICD-10-CM | POA: Diagnosis not present

## 2015-05-31 DIAGNOSIS — I10 Essential (primary) hypertension: Secondary | ICD-10-CM | POA: Diagnosis not present

## 2015-05-31 DIAGNOSIS — I5022 Chronic systolic (congestive) heart failure: Secondary | ICD-10-CM | POA: Diagnosis not present

## 2015-05-31 DIAGNOSIS — I4892 Unspecified atrial flutter: Secondary | ICD-10-CM | POA: Diagnosis not present

## 2015-05-31 DIAGNOSIS — I4891 Unspecified atrial fibrillation: Secondary | ICD-10-CM | POA: Diagnosis not present

## 2015-06-20 ENCOUNTER — Telehealth: Payer: Self-pay | Admitting: Radiation Oncology

## 2015-06-20 NOTE — Telephone Encounter (Signed)
Erroneous encounter

## 2015-06-21 DIAGNOSIS — F411 Generalized anxiety disorder: Secondary | ICD-10-CM | POA: Diagnosis not present

## 2015-06-21 DIAGNOSIS — M1990 Unspecified osteoarthritis, unspecified site: Secondary | ICD-10-CM | POA: Diagnosis not present

## 2015-06-21 DIAGNOSIS — E119 Type 2 diabetes mellitus without complications: Secondary | ICD-10-CM | POA: Diagnosis not present

## 2015-06-21 DIAGNOSIS — R972 Elevated prostate specific antigen [PSA]: Secondary | ICD-10-CM | POA: Diagnosis not present

## 2015-06-21 DIAGNOSIS — E7800 Pure hypercholesterolemia, unspecified: Secondary | ICD-10-CM | POA: Diagnosis not present

## 2015-06-21 DIAGNOSIS — M1A09X0 Idiopathic chronic gout, multiple sites, without tophus (tophi): Secondary | ICD-10-CM | POA: Diagnosis not present

## 2015-06-21 DIAGNOSIS — K7460 Unspecified cirrhosis of liver: Secondary | ICD-10-CM | POA: Diagnosis not present

## 2015-06-21 DIAGNOSIS — I10 Essential (primary) hypertension: Secondary | ICD-10-CM | POA: Diagnosis not present

## 2015-06-21 DIAGNOSIS — I5022 Chronic systolic (congestive) heart failure: Secondary | ICD-10-CM | POA: Diagnosis not present

## 2015-07-05 DIAGNOSIS — R7989 Other specified abnormal findings of blood chemistry: Secondary | ICD-10-CM | POA: Diagnosis not present

## 2015-07-26 DIAGNOSIS — I10 Essential (primary) hypertension: Secondary | ICD-10-CM | POA: Diagnosis not present

## 2015-07-26 DIAGNOSIS — N183 Chronic kidney disease, stage 3 (moderate): Secondary | ICD-10-CM | POA: Diagnosis not present

## 2015-07-26 DIAGNOSIS — N179 Acute kidney failure, unspecified: Secondary | ICD-10-CM | POA: Diagnosis not present

## 2015-08-04 DIAGNOSIS — N179 Acute kidney failure, unspecified: Secondary | ICD-10-CM | POA: Diagnosis not present

## 2015-08-04 DIAGNOSIS — N183 Chronic kidney disease, stage 3 (moderate): Secondary | ICD-10-CM | POA: Diagnosis not present

## 2015-08-04 DIAGNOSIS — I10 Essential (primary) hypertension: Secondary | ICD-10-CM | POA: Diagnosis not present

## 2015-08-30 DIAGNOSIS — N179 Acute kidney failure, unspecified: Secondary | ICD-10-CM | POA: Diagnosis not present

## 2015-08-30 DIAGNOSIS — N2581 Secondary hyperparathyroidism of renal origin: Secondary | ICD-10-CM | POA: Diagnosis not present

## 2015-08-30 DIAGNOSIS — N183 Chronic kidney disease, stage 3 (moderate): Secondary | ICD-10-CM | POA: Diagnosis not present

## 2015-08-30 DIAGNOSIS — I10 Essential (primary) hypertension: Secondary | ICD-10-CM | POA: Diagnosis not present

## 2015-12-06 DIAGNOSIS — I4892 Unspecified atrial flutter: Secondary | ICD-10-CM | POA: Diagnosis not present

## 2015-12-06 DIAGNOSIS — E782 Mixed hyperlipidemia: Secondary | ICD-10-CM | POA: Diagnosis not present

## 2015-12-06 DIAGNOSIS — I5021 Acute systolic (congestive) heart failure: Secondary | ICD-10-CM | POA: Diagnosis not present

## 2015-12-06 DIAGNOSIS — I5023 Acute on chronic systolic (congestive) heart failure: Secondary | ICD-10-CM | POA: Diagnosis not present

## 2015-12-06 DIAGNOSIS — I10 Essential (primary) hypertension: Secondary | ICD-10-CM | POA: Diagnosis not present

## 2015-12-06 DIAGNOSIS — I5022 Chronic systolic (congestive) heart failure: Secondary | ICD-10-CM | POA: Diagnosis not present

## 2015-12-06 DIAGNOSIS — I480 Paroxysmal atrial fibrillation: Secondary | ICD-10-CM | POA: Diagnosis not present

## 2015-12-19 DIAGNOSIS — N183 Chronic kidney disease, stage 3 (moderate): Secondary | ICD-10-CM | POA: Diagnosis not present

## 2015-12-19 DIAGNOSIS — E875 Hyperkalemia: Secondary | ICD-10-CM | POA: Diagnosis not present

## 2015-12-19 DIAGNOSIS — I10 Essential (primary) hypertension: Secondary | ICD-10-CM | POA: Diagnosis not present

## 2015-12-19 DIAGNOSIS — N2581 Secondary hyperparathyroidism of renal origin: Secondary | ICD-10-CM | POA: Diagnosis not present

## 2015-12-21 DIAGNOSIS — F411 Generalized anxiety disorder: Secondary | ICD-10-CM | POA: Diagnosis not present

## 2015-12-21 DIAGNOSIS — M1A09X0 Idiopathic chronic gout, multiple sites, without tophus (tophi): Secondary | ICD-10-CM | POA: Diagnosis not present

## 2015-12-21 DIAGNOSIS — M130 Polyarthritis, unspecified: Secondary | ICD-10-CM | POA: Diagnosis not present

## 2015-12-21 DIAGNOSIS — E1122 Type 2 diabetes mellitus with diabetic chronic kidney disease: Secondary | ICD-10-CM | POA: Diagnosis not present

## 2015-12-21 DIAGNOSIS — K7460 Unspecified cirrhosis of liver: Secondary | ICD-10-CM | POA: Diagnosis not present

## 2015-12-21 DIAGNOSIS — E7800 Pure hypercholesterolemia, unspecified: Secondary | ICD-10-CM | POA: Diagnosis not present

## 2015-12-21 DIAGNOSIS — I10 Essential (primary) hypertension: Secondary | ICD-10-CM | POA: Diagnosis not present

## 2015-12-21 DIAGNOSIS — N184 Chronic kidney disease, stage 4 (severe): Secondary | ICD-10-CM | POA: Diagnosis not present

## 2015-12-21 DIAGNOSIS — I5022 Chronic systolic (congestive) heart failure: Secondary | ICD-10-CM | POA: Diagnosis not present

## 2016-02-21 DIAGNOSIS — Z23 Encounter for immunization: Secondary | ICD-10-CM | POA: Diagnosis not present

## 2016-02-27 DIAGNOSIS — H25013 Cortical age-related cataract, bilateral: Secondary | ICD-10-CM | POA: Diagnosis not present

## 2016-04-15 DIAGNOSIS — E875 Hyperkalemia: Secondary | ICD-10-CM | POA: Diagnosis not present

## 2016-04-15 DIAGNOSIS — I10 Essential (primary) hypertension: Secondary | ICD-10-CM | POA: Diagnosis not present

## 2016-04-15 DIAGNOSIS — N183 Chronic kidney disease, stage 3 (moderate): Secondary | ICD-10-CM | POA: Diagnosis not present

## 2016-04-15 DIAGNOSIS — N2581 Secondary hyperparathyroidism of renal origin: Secondary | ICD-10-CM | POA: Diagnosis not present

## 2016-05-16 DIAGNOSIS — I10 Essential (primary) hypertension: Secondary | ICD-10-CM | POA: Diagnosis not present

## 2016-05-16 DIAGNOSIS — I4892 Unspecified atrial flutter: Secondary | ICD-10-CM | POA: Diagnosis not present

## 2016-05-16 DIAGNOSIS — I5022 Chronic systolic (congestive) heart failure: Secondary | ICD-10-CM | POA: Diagnosis not present

## 2016-05-16 DIAGNOSIS — E782 Mixed hyperlipidemia: Secondary | ICD-10-CM | POA: Diagnosis not present

## 2016-06-23 IMAGING — CT CT CHEST-ABD-PELV W/ CM
2 of 5 series · 14 of 36 positions shown, 19 images · IV contrast (isovue)
Comparison: Ultrasound dated 07/12/2014

CLINICAL DATA: Ascites and pleural effusions of unknown etiology.
Elevated liver enzymes.

EXAM:
CT CHEST, ABDOMEN, AND PELVIS WITH CONTRAST
TECHNIQUE: Multidetector CT imaging of the chest, abdomen and pelvis was
performed following the standard protocol during bolus
administration of intravenous contrast.
CONTRAST:  100 cc Isovue 370

[Series 2: soft tissue · axial · 0.85mm/px · z∈[-881,-296]mm · 11 of 137 slices shown, 16 images]
[im 10/137  mediastinal]
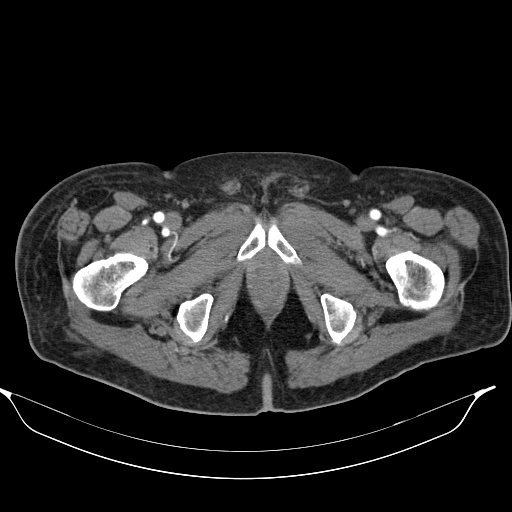
[im 10/137  bone]
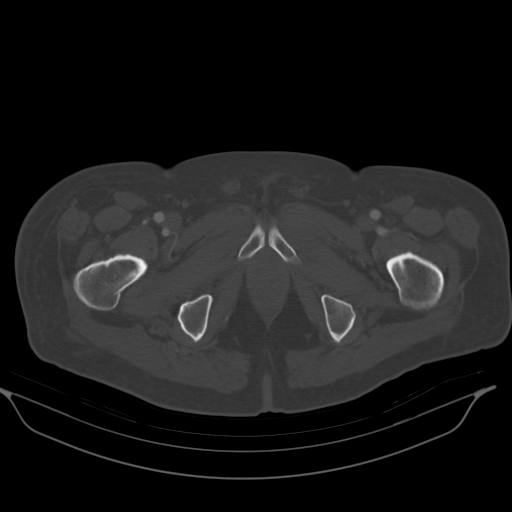
[im 28/137  mediastinal]
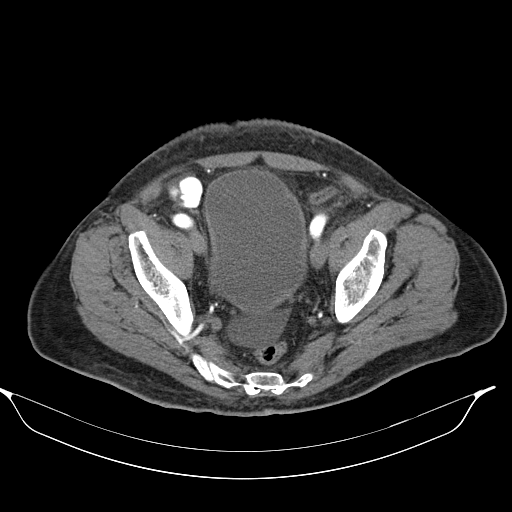
[im 37/137  mediastinal]
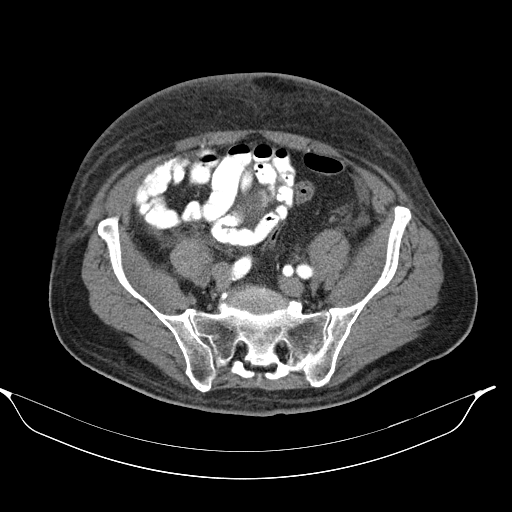
[im 46/137  mediastinal]
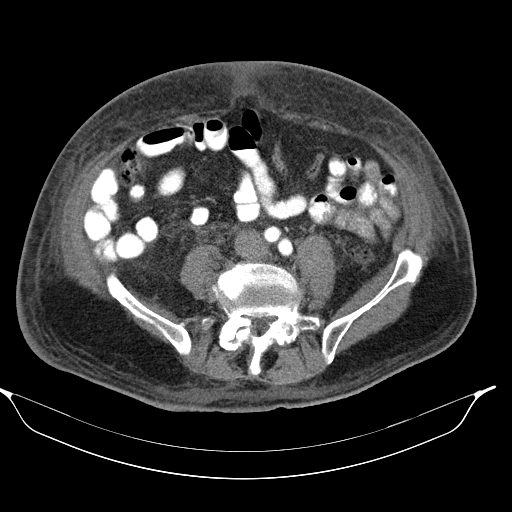
[im 64/137  mediastinal]
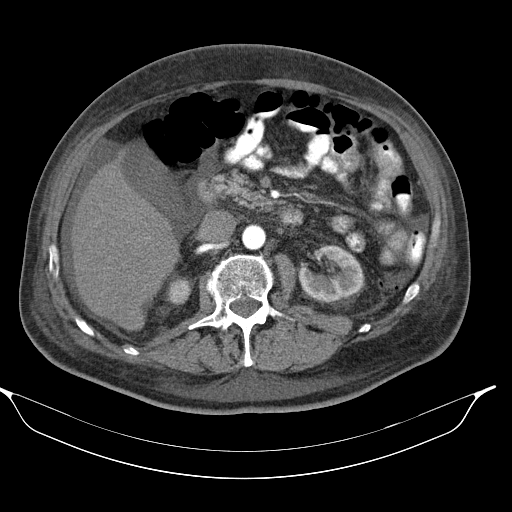
[im 73/137  mediastinal]
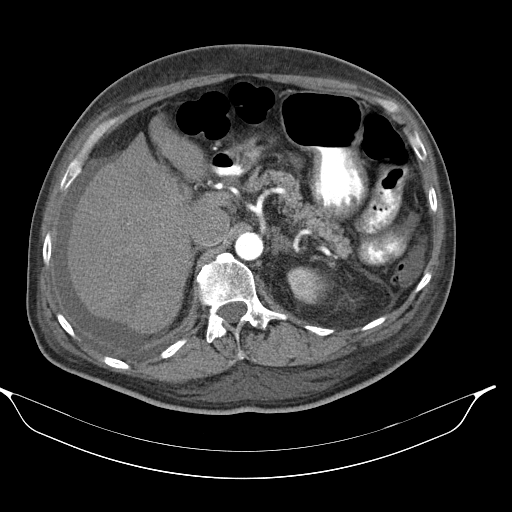
[im 91/137  mediastinal]
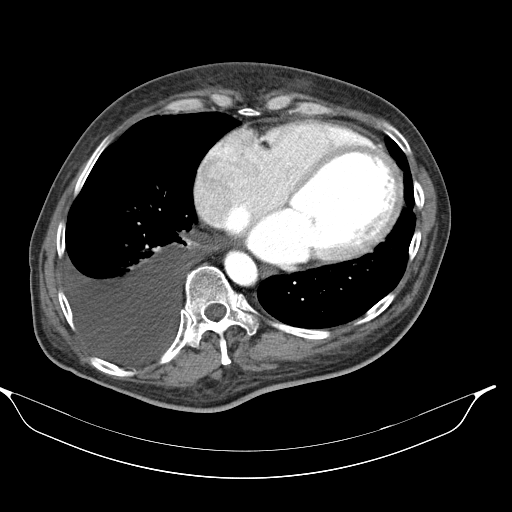
[im 100/137  mediastinal]
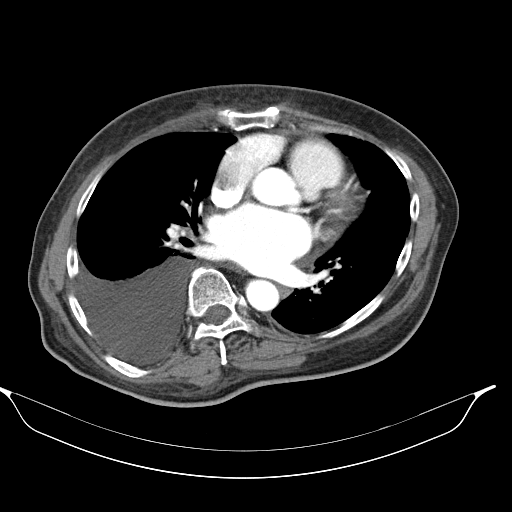
[im 100/137  lung]
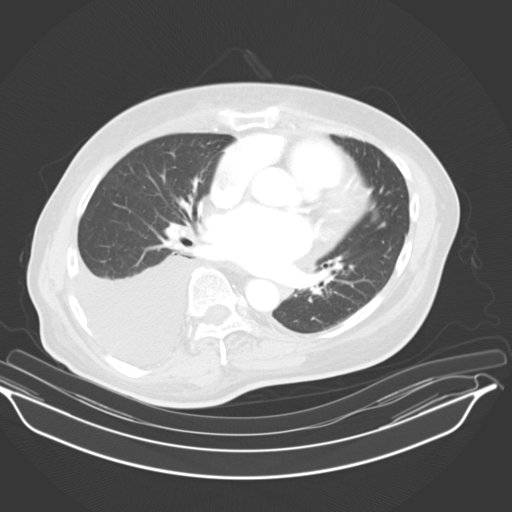
[im 109/137  mediastinal]
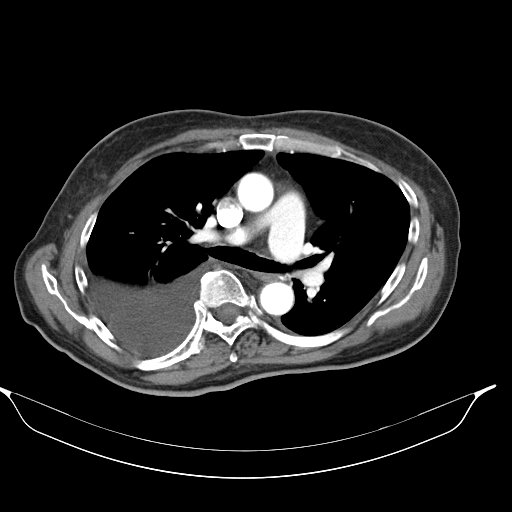
[im 109/137  lung]
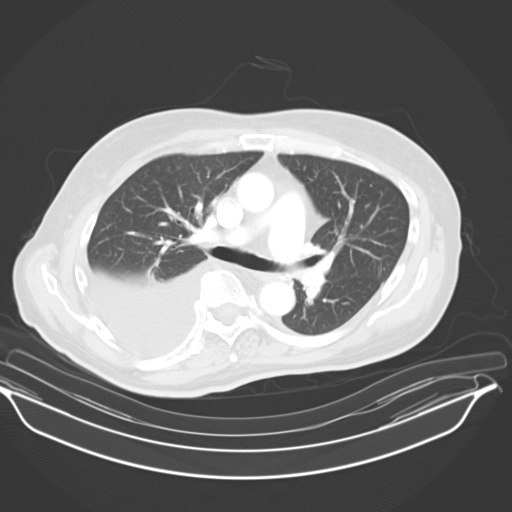
[im 109/137  bone]
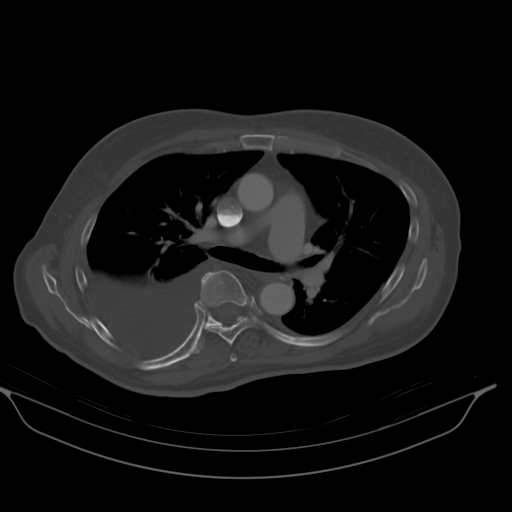
[im 118/137  lung]
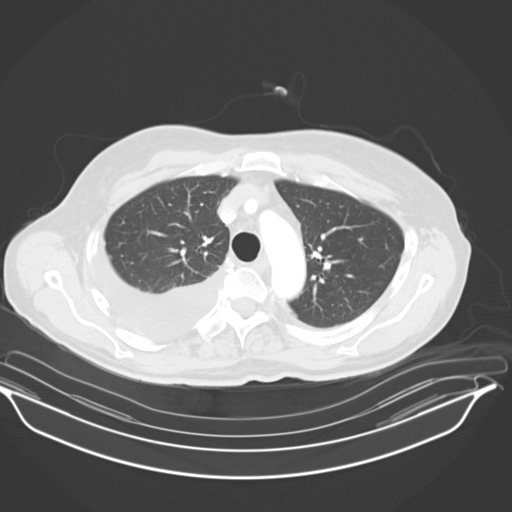
[im 127/137  mediastinal]
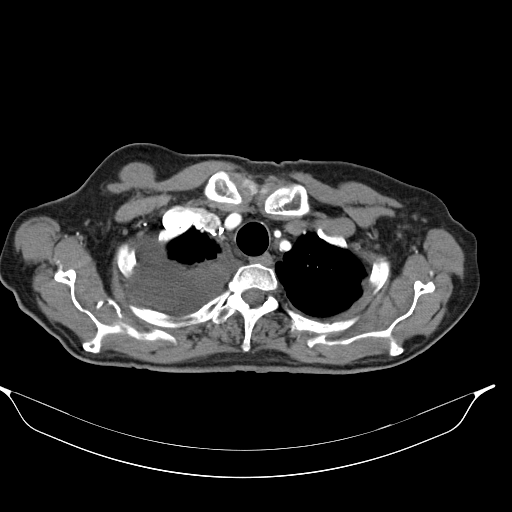
[im 127/137  lung]
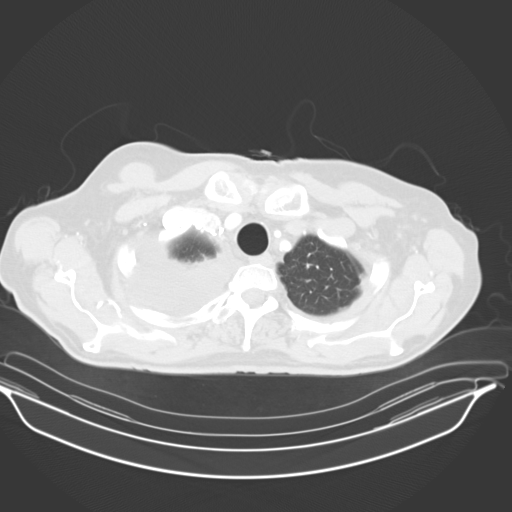

[Series 602: coronal · coronal · 1.34mm/px · 3 of 147 slices shown]
[im 30/147  mediastinal]
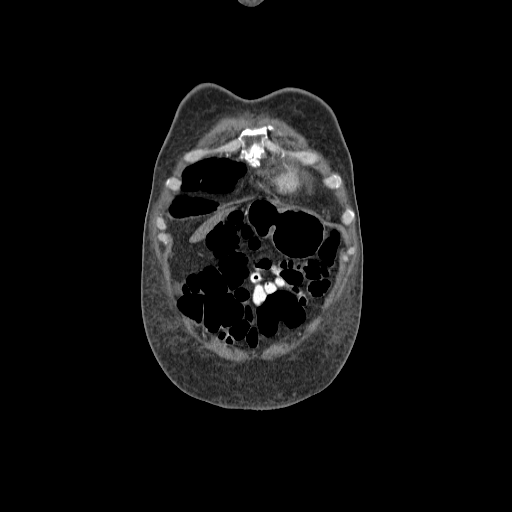
[im 59/147  mediastinal]
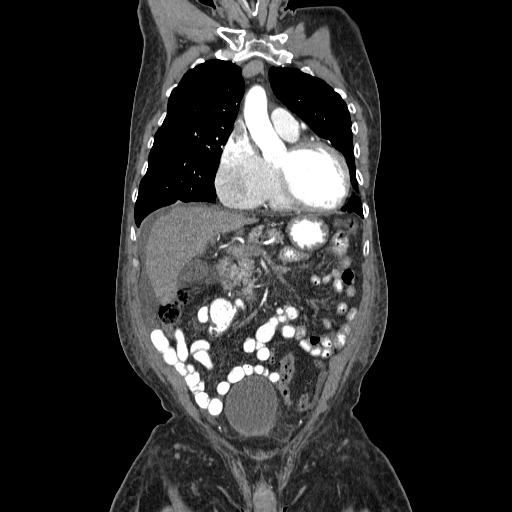
[im 88/147  mediastinal]
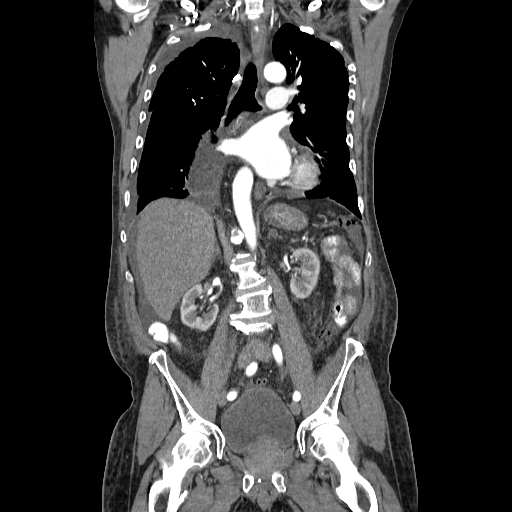

[14 of 36 positions shown; findings below may reference images not displayed]

FINDINGS: CT CHEST FINDINGS

There is a large right pleural effusion with slight compressive
atelectasis in the right lower lobe. Cardiomegaly with diffuse
chamber dilatation.

No left pleural effusion. No infiltrates. Pulmonary vascularity is
normal. Chronic thoracolumbar scoliosis.

CT ABDOMEN AND PELVIS FINDINGS

The liver is small and inhomogeneous. There is an area of chronic
infarction of the posterior aspect of the right lobe and of the
anterior aspect of the lateral segment of the left lobe.

There is a clot in the portal vein. There is chronic occlusion of
the splenic vein. The spleen is not enlarged. Pancreas and adrenal
glands and kidneys are normal.

There is a small amount of ascites. There is anasarca in the
subcutaneous fat of the abdomen.

No dilated bowel. Terminal ileum and appendix are normal. Bladder is
normal. Prostate gland is slightly prominent. No adenopathy.
Inferior vena cava and iliac veins are slightly distended.
IMPRESSION: 1. Large right pleural effusion with marked cardiomegaly. Distended
inferior vena cava consistent with elevated right heart pressure.
2. Small liver with areas of infarction but no nodularity of the
contour to suggest cirrhosis, nonocclusive thrombus in the main
portal vein, chronic splenic vein occlusion, and slight ascites.
Does the patient have any known coagulopathy?
3. Anasarca.

## 2016-06-27 DIAGNOSIS — E7800 Pure hypercholesterolemia, unspecified: Secondary | ICD-10-CM | POA: Diagnosis not present

## 2016-06-27 DIAGNOSIS — N184 Chronic kidney disease, stage 4 (severe): Secondary | ICD-10-CM | POA: Diagnosis not present

## 2016-06-27 DIAGNOSIS — L989 Disorder of the skin and subcutaneous tissue, unspecified: Secondary | ICD-10-CM | POA: Diagnosis not present

## 2016-06-27 DIAGNOSIS — E1122 Type 2 diabetes mellitus with diabetic chronic kidney disease: Secondary | ICD-10-CM | POA: Diagnosis not present

## 2016-06-27 DIAGNOSIS — I5022 Chronic systolic (congestive) heart failure: Secondary | ICD-10-CM | POA: Diagnosis not present

## 2016-06-27 DIAGNOSIS — M7062 Trochanteric bursitis, left hip: Secondary | ICD-10-CM | POA: Diagnosis not present

## 2016-06-27 DIAGNOSIS — I10 Essential (primary) hypertension: Secondary | ICD-10-CM | POA: Diagnosis not present

## 2016-06-27 DIAGNOSIS — M130 Polyarthritis, unspecified: Secondary | ICD-10-CM | POA: Diagnosis not present

## 2016-06-27 DIAGNOSIS — M1A09X0 Idiopathic chronic gout, multiple sites, without tophus (tophi): Secondary | ICD-10-CM | POA: Diagnosis not present

## 2016-06-27 DIAGNOSIS — K7460 Unspecified cirrhosis of liver: Secondary | ICD-10-CM | POA: Diagnosis not present

## 2016-06-27 DIAGNOSIS — F411 Generalized anxiety disorder: Secondary | ICD-10-CM | POA: Diagnosis not present

## 2016-07-30 DIAGNOSIS — I4892 Unspecified atrial flutter: Secondary | ICD-10-CM | POA: Diagnosis not present

## 2016-07-30 DIAGNOSIS — I5022 Chronic systolic (congestive) heart failure: Secondary | ICD-10-CM | POA: Diagnosis not present

## 2016-08-05 DIAGNOSIS — N183 Chronic kidney disease, stage 3 (moderate): Secondary | ICD-10-CM | POA: Diagnosis not present

## 2016-08-05 DIAGNOSIS — E875 Hyperkalemia: Secondary | ICD-10-CM | POA: Diagnosis not present

## 2016-08-05 DIAGNOSIS — I10 Essential (primary) hypertension: Secondary | ICD-10-CM | POA: Diagnosis not present

## 2016-08-05 DIAGNOSIS — N2581 Secondary hyperparathyroidism of renal origin: Secondary | ICD-10-CM | POA: Diagnosis not present

## 2016-08-06 DIAGNOSIS — I4892 Unspecified atrial flutter: Secondary | ICD-10-CM | POA: Diagnosis not present

## 2016-08-06 DIAGNOSIS — I5022 Chronic systolic (congestive) heart failure: Secondary | ICD-10-CM | POA: Diagnosis not present

## 2016-08-06 DIAGNOSIS — I10 Essential (primary) hypertension: Secondary | ICD-10-CM | POA: Diagnosis not present

## 2016-08-06 DIAGNOSIS — E782 Mixed hyperlipidemia: Secondary | ICD-10-CM | POA: Diagnosis not present

## 2016-11-20 DIAGNOSIS — N2581 Secondary hyperparathyroidism of renal origin: Secondary | ICD-10-CM | POA: Diagnosis not present

## 2016-11-20 DIAGNOSIS — N183 Chronic kidney disease, stage 3 (moderate): Secondary | ICD-10-CM | POA: Diagnosis not present

## 2016-11-20 DIAGNOSIS — E875 Hyperkalemia: Secondary | ICD-10-CM | POA: Diagnosis not present

## 2016-11-20 DIAGNOSIS — I10 Essential (primary) hypertension: Secondary | ICD-10-CM | POA: Diagnosis not present

## 2016-12-30 DIAGNOSIS — M130 Polyarthritis, unspecified: Secondary | ICD-10-CM | POA: Diagnosis not present

## 2016-12-30 DIAGNOSIS — E1122 Type 2 diabetes mellitus with diabetic chronic kidney disease: Secondary | ICD-10-CM | POA: Diagnosis not present

## 2016-12-30 DIAGNOSIS — E7800 Pure hypercholesterolemia, unspecified: Secondary | ICD-10-CM | POA: Diagnosis not present

## 2016-12-30 DIAGNOSIS — I10 Essential (primary) hypertension: Secondary | ICD-10-CM | POA: Diagnosis not present

## 2016-12-30 DIAGNOSIS — M1A09X0 Idiopathic chronic gout, multiple sites, without tophus (tophi): Secondary | ICD-10-CM | POA: Diagnosis not present

## 2016-12-30 DIAGNOSIS — K7460 Unspecified cirrhosis of liver: Secondary | ICD-10-CM | POA: Diagnosis not present

## 2016-12-30 DIAGNOSIS — F411 Generalized anxiety disorder: Secondary | ICD-10-CM | POA: Diagnosis not present

## 2016-12-30 DIAGNOSIS — I5022 Chronic systolic (congestive) heart failure: Secondary | ICD-10-CM | POA: Diagnosis not present

## 2016-12-30 DIAGNOSIS — N184 Chronic kidney disease, stage 4 (severe): Secondary | ICD-10-CM | POA: Diagnosis not present

## 2017-01-21 DIAGNOSIS — I4892 Unspecified atrial flutter: Secondary | ICD-10-CM | POA: Diagnosis not present

## 2017-01-21 DIAGNOSIS — I5022 Chronic systolic (congestive) heart failure: Secondary | ICD-10-CM | POA: Diagnosis not present

## 2017-01-21 DIAGNOSIS — I10 Essential (primary) hypertension: Secondary | ICD-10-CM | POA: Diagnosis not present

## 2017-02-11 DIAGNOSIS — Z23 Encounter for immunization: Secondary | ICD-10-CM | POA: Diagnosis not present

## 2017-03-27 DIAGNOSIS — E875 Hyperkalemia: Secondary | ICD-10-CM | POA: Diagnosis not present

## 2017-03-27 DIAGNOSIS — N184 Chronic kidney disease, stage 4 (severe): Secondary | ICD-10-CM | POA: Diagnosis not present

## 2017-03-27 DIAGNOSIS — N2581 Secondary hyperparathyroidism of renal origin: Secondary | ICD-10-CM | POA: Diagnosis not present

## 2017-03-27 DIAGNOSIS — I10 Essential (primary) hypertension: Secondary | ICD-10-CM | POA: Diagnosis not present

## 2017-07-02 DIAGNOSIS — N184 Chronic kidney disease, stage 4 (severe): Secondary | ICD-10-CM | POA: Diagnosis not present

## 2017-07-02 DIAGNOSIS — M1A09X0 Idiopathic chronic gout, multiple sites, without tophus (tophi): Secondary | ICD-10-CM | POA: Diagnosis not present

## 2017-07-02 DIAGNOSIS — I4892 Unspecified atrial flutter: Secondary | ICD-10-CM | POA: Diagnosis not present

## 2017-07-02 DIAGNOSIS — I5022 Chronic systolic (congestive) heart failure: Secondary | ICD-10-CM | POA: Diagnosis not present

## 2017-07-02 DIAGNOSIS — E1122 Type 2 diabetes mellitus with diabetic chronic kidney disease: Secondary | ICD-10-CM | POA: Diagnosis not present

## 2017-07-02 DIAGNOSIS — K7460 Unspecified cirrhosis of liver: Secondary | ICD-10-CM | POA: Diagnosis not present

## 2017-07-02 DIAGNOSIS — F411 Generalized anxiety disorder: Secondary | ICD-10-CM | POA: Diagnosis not present

## 2017-07-02 DIAGNOSIS — R188 Other ascites: Secondary | ICD-10-CM | POA: Diagnosis not present

## 2017-07-02 DIAGNOSIS — M130 Polyarthritis, unspecified: Secondary | ICD-10-CM | POA: Diagnosis not present

## 2017-07-02 DIAGNOSIS — E7800 Pure hypercholesterolemia, unspecified: Secondary | ICD-10-CM | POA: Diagnosis not present

## 2017-07-02 DIAGNOSIS — I10 Essential (primary) hypertension: Secondary | ICD-10-CM | POA: Diagnosis not present

## 2017-07-15 DIAGNOSIS — N183 Chronic kidney disease, stage 3 (moderate): Secondary | ICD-10-CM | POA: Diagnosis not present

## 2017-07-15 DIAGNOSIS — I4892 Unspecified atrial flutter: Secondary | ICD-10-CM | POA: Diagnosis not present

## 2017-07-15 DIAGNOSIS — I10 Essential (primary) hypertension: Secondary | ICD-10-CM | POA: Diagnosis not present

## 2017-07-15 DIAGNOSIS — E782 Mixed hyperlipidemia: Secondary | ICD-10-CM | POA: Diagnosis not present

## 2017-07-15 DIAGNOSIS — I5022 Chronic systolic (congestive) heart failure: Secondary | ICD-10-CM | POA: Diagnosis not present

## 2017-07-15 DIAGNOSIS — E119 Type 2 diabetes mellitus without complications: Secondary | ICD-10-CM | POA: Diagnosis not present

## 2017-07-24 DIAGNOSIS — E875 Hyperkalemia: Secondary | ICD-10-CM | POA: Diagnosis not present

## 2017-07-24 DIAGNOSIS — I10 Essential (primary) hypertension: Secondary | ICD-10-CM | POA: Diagnosis not present

## 2017-07-24 DIAGNOSIS — N2581 Secondary hyperparathyroidism of renal origin: Secondary | ICD-10-CM | POA: Diagnosis not present

## 2017-07-24 DIAGNOSIS — N183 Chronic kidney disease, stage 3 (moderate): Secondary | ICD-10-CM | POA: Diagnosis not present

## 2017-07-24 DIAGNOSIS — E873 Alkalosis: Secondary | ICD-10-CM | POA: Diagnosis not present

## 2017-11-27 DIAGNOSIS — E875 Hyperkalemia: Secondary | ICD-10-CM | POA: Diagnosis not present

## 2017-11-27 DIAGNOSIS — N184 Chronic kidney disease, stage 4 (severe): Secondary | ICD-10-CM | POA: Diagnosis not present

## 2017-11-27 DIAGNOSIS — N2581 Secondary hyperparathyroidism of renal origin: Secondary | ICD-10-CM | POA: Diagnosis not present

## 2017-11-27 DIAGNOSIS — I10 Essential (primary) hypertension: Secondary | ICD-10-CM | POA: Diagnosis not present

## 2018-01-07 DIAGNOSIS — R188 Other ascites: Secondary | ICD-10-CM | POA: Diagnosis not present

## 2018-01-07 DIAGNOSIS — M1A09X0 Idiopathic chronic gout, multiple sites, without tophus (tophi): Secondary | ICD-10-CM | POA: Diagnosis not present

## 2018-01-07 DIAGNOSIS — E1122 Type 2 diabetes mellitus with diabetic chronic kidney disease: Secondary | ICD-10-CM | POA: Diagnosis not present

## 2018-01-07 DIAGNOSIS — F411 Generalized anxiety disorder: Secondary | ICD-10-CM | POA: Diagnosis not present

## 2018-01-07 DIAGNOSIS — I5022 Chronic systolic (congestive) heart failure: Secondary | ICD-10-CM | POA: Diagnosis not present

## 2018-01-07 DIAGNOSIS — N184 Chronic kidney disease, stage 4 (severe): Secondary | ICD-10-CM | POA: Diagnosis not present

## 2018-01-07 DIAGNOSIS — K7460 Unspecified cirrhosis of liver: Secondary | ICD-10-CM | POA: Diagnosis not present

## 2018-01-07 DIAGNOSIS — L989 Disorder of the skin and subcutaneous tissue, unspecified: Secondary | ICD-10-CM | POA: Diagnosis not present

## 2018-01-07 DIAGNOSIS — E7800 Pure hypercholesterolemia, unspecified: Secondary | ICD-10-CM | POA: Diagnosis not present

## 2018-01-07 DIAGNOSIS — I10 Essential (primary) hypertension: Secondary | ICD-10-CM | POA: Diagnosis not present

## 2018-01-07 DIAGNOSIS — M130 Polyarthritis, unspecified: Secondary | ICD-10-CM | POA: Diagnosis not present

## 2018-01-30 DIAGNOSIS — H25013 Cortical age-related cataract, bilateral: Secondary | ICD-10-CM | POA: Diagnosis not present

## 2018-02-03 DIAGNOSIS — I10 Essential (primary) hypertension: Secondary | ICD-10-CM | POA: Diagnosis not present

## 2018-02-03 DIAGNOSIS — E119 Type 2 diabetes mellitus without complications: Secondary | ICD-10-CM | POA: Diagnosis not present

## 2018-02-03 DIAGNOSIS — E782 Mixed hyperlipidemia: Secondary | ICD-10-CM | POA: Diagnosis not present

## 2018-02-03 DIAGNOSIS — N183 Chronic kidney disease, stage 3 (moderate): Secondary | ICD-10-CM | POA: Diagnosis not present

## 2018-02-03 DIAGNOSIS — I5022 Chronic systolic (congestive) heart failure: Secondary | ICD-10-CM | POA: Diagnosis not present

## 2018-02-03 DIAGNOSIS — I4892 Unspecified atrial flutter: Secondary | ICD-10-CM | POA: Diagnosis not present

## 2018-02-27 DIAGNOSIS — Z23 Encounter for immunization: Secondary | ICD-10-CM | POA: Diagnosis not present

## 2018-03-19 DIAGNOSIS — N2581 Secondary hyperparathyroidism of renal origin: Secondary | ICD-10-CM | POA: Diagnosis not present

## 2018-03-19 DIAGNOSIS — E875 Hyperkalemia: Secondary | ICD-10-CM | POA: Diagnosis not present

## 2018-03-19 DIAGNOSIS — N184 Chronic kidney disease, stage 4 (severe): Secondary | ICD-10-CM | POA: Diagnosis not present

## 2018-03-19 DIAGNOSIS — I10 Essential (primary) hypertension: Secondary | ICD-10-CM | POA: Diagnosis not present

## 2018-06-16 DIAGNOSIS — D485 Neoplasm of uncertain behavior of skin: Secondary | ICD-10-CM | POA: Diagnosis not present

## 2018-06-16 DIAGNOSIS — L570 Actinic keratosis: Secondary | ICD-10-CM | POA: Diagnosis not present

## 2018-06-16 DIAGNOSIS — C4442 Squamous cell carcinoma of skin of scalp and neck: Secondary | ICD-10-CM | POA: Diagnosis not present

## 2018-06-16 DIAGNOSIS — C44219 Basal cell carcinoma of skin of left ear and external auricular canal: Secondary | ICD-10-CM | POA: Diagnosis not present

## 2018-07-01 DIAGNOSIS — C44319 Basal cell carcinoma of skin of other parts of face: Secondary | ICD-10-CM | POA: Diagnosis not present

## 2018-07-01 DIAGNOSIS — D0439 Carcinoma in situ of skin of other parts of face: Secondary | ICD-10-CM | POA: Diagnosis not present

## 2018-07-01 DIAGNOSIS — L570 Actinic keratosis: Secondary | ICD-10-CM | POA: Diagnosis not present

## 2018-07-01 DIAGNOSIS — D485 Neoplasm of uncertain behavior of skin: Secondary | ICD-10-CM | POA: Diagnosis not present

## 2018-07-15 DIAGNOSIS — D485 Neoplasm of uncertain behavior of skin: Secondary | ICD-10-CM | POA: Diagnosis not present

## 2018-07-15 DIAGNOSIS — D0439 Carcinoma in situ of skin of other parts of face: Secondary | ICD-10-CM | POA: Diagnosis not present

## 2018-07-15 DIAGNOSIS — L570 Actinic keratosis: Secondary | ICD-10-CM | POA: Diagnosis not present

## 2018-07-17 DIAGNOSIS — M1A09X0 Idiopathic chronic gout, multiple sites, without tophus (tophi): Secondary | ICD-10-CM | POA: Diagnosis not present

## 2018-07-17 DIAGNOSIS — N184 Chronic kidney disease, stage 4 (severe): Secondary | ICD-10-CM | POA: Diagnosis not present

## 2018-07-17 DIAGNOSIS — E7800 Pure hypercholesterolemia, unspecified: Secondary | ICD-10-CM | POA: Diagnosis not present

## 2018-07-17 DIAGNOSIS — K7460 Unspecified cirrhosis of liver: Secondary | ICD-10-CM | POA: Diagnosis not present

## 2018-07-17 DIAGNOSIS — I4892 Unspecified atrial flutter: Secondary | ICD-10-CM | POA: Diagnosis not present

## 2018-07-17 DIAGNOSIS — E1122 Type 2 diabetes mellitus with diabetic chronic kidney disease: Secondary | ICD-10-CM | POA: Diagnosis not present

## 2018-07-17 DIAGNOSIS — I5022 Chronic systolic (congestive) heart failure: Secondary | ICD-10-CM | POA: Diagnosis not present

## 2018-07-17 DIAGNOSIS — M130 Polyarthritis, unspecified: Secondary | ICD-10-CM | POA: Diagnosis not present

## 2018-07-17 DIAGNOSIS — I10 Essential (primary) hypertension: Secondary | ICD-10-CM | POA: Diagnosis not present

## 2018-07-17 DIAGNOSIS — R188 Other ascites: Secondary | ICD-10-CM | POA: Diagnosis not present

## 2018-07-17 DIAGNOSIS — L989 Disorder of the skin and subcutaneous tissue, unspecified: Secondary | ICD-10-CM | POA: Diagnosis not present

## 2018-07-17 DIAGNOSIS — F411 Generalized anxiety disorder: Secondary | ICD-10-CM | POA: Diagnosis not present

## 2018-07-17 DIAGNOSIS — Z0000 Encounter for general adult medical examination without abnormal findings: Secondary | ICD-10-CM | POA: Diagnosis not present

## 2018-07-20 DIAGNOSIS — N184 Chronic kidney disease, stage 4 (severe): Secondary | ICD-10-CM | POA: Diagnosis not present

## 2018-07-20 DIAGNOSIS — I10 Essential (primary) hypertension: Secondary | ICD-10-CM | POA: Diagnosis not present

## 2018-07-20 DIAGNOSIS — E7800 Pure hypercholesterolemia, unspecified: Secondary | ICD-10-CM | POA: Diagnosis not present

## 2018-07-20 DIAGNOSIS — E1122 Type 2 diabetes mellitus with diabetic chronic kidney disease: Secondary | ICD-10-CM | POA: Diagnosis not present

## 2018-07-20 DIAGNOSIS — E875 Hyperkalemia: Secondary | ICD-10-CM | POA: Diagnosis not present

## 2018-07-20 DIAGNOSIS — N183 Chronic kidney disease, stage 3 (moderate): Secondary | ICD-10-CM | POA: Diagnosis not present

## 2018-07-20 DIAGNOSIS — N2581 Secondary hyperparathyroidism of renal origin: Secondary | ICD-10-CM | POA: Diagnosis not present

## 2018-08-11 DIAGNOSIS — E782 Mixed hyperlipidemia: Secondary | ICD-10-CM | POA: Diagnosis not present

## 2018-08-11 DIAGNOSIS — I5022 Chronic systolic (congestive) heart failure: Secondary | ICD-10-CM | POA: Diagnosis not present

## 2018-08-11 DIAGNOSIS — E119 Type 2 diabetes mellitus without complications: Secondary | ICD-10-CM | POA: Diagnosis not present

## 2018-08-11 DIAGNOSIS — I4892 Unspecified atrial flutter: Secondary | ICD-10-CM | POA: Diagnosis not present

## 2018-08-11 DIAGNOSIS — I10 Essential (primary) hypertension: Secondary | ICD-10-CM | POA: Diagnosis not present

## 2018-09-14 DIAGNOSIS — D0439 Carcinoma in situ of skin of other parts of face: Secondary | ICD-10-CM | POA: Diagnosis not present

## 2018-09-14 DIAGNOSIS — C4442 Squamous cell carcinoma of skin of scalp and neck: Secondary | ICD-10-CM | POA: Diagnosis not present

## 2018-11-11 DIAGNOSIS — C44329 Squamous cell carcinoma of skin of other parts of face: Secondary | ICD-10-CM | POA: Diagnosis not present

## 2018-11-20 DIAGNOSIS — E875 Hyperkalemia: Secondary | ICD-10-CM | POA: Diagnosis not present

## 2018-11-20 DIAGNOSIS — N184 Chronic kidney disease, stage 4 (severe): Secondary | ICD-10-CM | POA: Diagnosis not present

## 2018-11-20 DIAGNOSIS — N2581 Secondary hyperparathyroidism of renal origin: Secondary | ICD-10-CM | POA: Diagnosis not present

## 2018-11-20 DIAGNOSIS — I10 Essential (primary) hypertension: Secondary | ICD-10-CM | POA: Diagnosis not present

## 2019-01-07 DIAGNOSIS — C44329 Squamous cell carcinoma of skin of other parts of face: Secondary | ICD-10-CM | POA: Diagnosis not present

## 2019-01-07 DIAGNOSIS — D0439 Carcinoma in situ of skin of other parts of face: Secondary | ICD-10-CM | POA: Diagnosis not present

## 2019-01-21 DIAGNOSIS — K7460 Unspecified cirrhosis of liver: Secondary | ICD-10-CM | POA: Diagnosis not present

## 2019-01-21 DIAGNOSIS — F411 Generalized anxiety disorder: Secondary | ICD-10-CM | POA: Diagnosis not present

## 2019-01-21 DIAGNOSIS — I130 Hypertensive heart and chronic kidney disease with heart failure and stage 1 through stage 4 chronic kidney disease, or unspecified chronic kidney disease: Secondary | ICD-10-CM | POA: Diagnosis not present

## 2019-01-21 DIAGNOSIS — M1A09X0 Idiopathic chronic gout, multiple sites, without tophus (tophi): Secondary | ICD-10-CM | POA: Diagnosis not present

## 2019-01-21 DIAGNOSIS — N184 Chronic kidney disease, stage 4 (severe): Secondary | ICD-10-CM | POA: Diagnosis not present

## 2019-01-21 DIAGNOSIS — L989 Disorder of the skin and subcutaneous tissue, unspecified: Secondary | ICD-10-CM | POA: Diagnosis not present

## 2019-01-21 DIAGNOSIS — I4892 Unspecified atrial flutter: Secondary | ICD-10-CM | POA: Diagnosis not present

## 2019-01-21 DIAGNOSIS — E1122 Type 2 diabetes mellitus with diabetic chronic kidney disease: Secondary | ICD-10-CM | POA: Diagnosis not present

## 2019-01-21 DIAGNOSIS — I5022 Chronic systolic (congestive) heart failure: Secondary | ICD-10-CM | POA: Diagnosis not present

## 2019-01-21 DIAGNOSIS — E7800 Pure hypercholesterolemia, unspecified: Secondary | ICD-10-CM | POA: Diagnosis not present

## 2019-01-21 DIAGNOSIS — H6123 Impacted cerumen, bilateral: Secondary | ICD-10-CM | POA: Diagnosis not present

## 2019-01-21 DIAGNOSIS — M130 Polyarthritis, unspecified: Secondary | ICD-10-CM | POA: Diagnosis not present

## 2019-02-03 DIAGNOSIS — I4892 Unspecified atrial flutter: Secondary | ICD-10-CM | POA: Diagnosis not present

## 2019-02-03 DIAGNOSIS — E782 Mixed hyperlipidemia: Secondary | ICD-10-CM | POA: Diagnosis not present

## 2019-02-03 DIAGNOSIS — I10 Essential (primary) hypertension: Secondary | ICD-10-CM | POA: Diagnosis not present

## 2019-02-03 DIAGNOSIS — I5022 Chronic systolic (congestive) heart failure: Secondary | ICD-10-CM | POA: Diagnosis not present

## 2019-02-23 DIAGNOSIS — R002 Palpitations: Secondary | ICD-10-CM | POA: Diagnosis not present

## 2019-03-12 DIAGNOSIS — Z23 Encounter for immunization: Secondary | ICD-10-CM | POA: Diagnosis not present

## 2019-03-24 DIAGNOSIS — N1831 Chronic kidney disease, stage 3a: Secondary | ICD-10-CM | POA: Diagnosis not present

## 2019-03-24 DIAGNOSIS — I10 Essential (primary) hypertension: Secondary | ICD-10-CM | POA: Diagnosis not present

## 2019-03-24 DIAGNOSIS — I5022 Chronic systolic (congestive) heart failure: Secondary | ICD-10-CM | POA: Diagnosis not present

## 2019-03-24 DIAGNOSIS — I4892 Unspecified atrial flutter: Secondary | ICD-10-CM | POA: Diagnosis not present

## 2019-03-26 DIAGNOSIS — N1832 Chronic kidney disease, stage 3b: Secondary | ICD-10-CM | POA: Diagnosis not present

## 2019-03-26 DIAGNOSIS — E875 Hyperkalemia: Secondary | ICD-10-CM | POA: Diagnosis not present

## 2019-03-26 DIAGNOSIS — I10 Essential (primary) hypertension: Secondary | ICD-10-CM | POA: Diagnosis not present

## 2019-03-26 DIAGNOSIS — N2581 Secondary hyperparathyroidism of renal origin: Secondary | ICD-10-CM | POA: Diagnosis not present

## 2019-10-21 ENCOUNTER — Encounter: Payer: Self-pay | Admitting: Ophthalmology

## 2019-10-21 NOTE — Anesthesia Preprocedure Evaluation (Addendum)
Anesthesia Evaluation  Patient identified by MRN, date of birth, ID band Patient awake    Reviewed: NPO status   History of Anesthesia Complications Negative for: history of anesthetic complications  Airway Mallampati: II  TM Distance: >3 FB Neck ROM: full    Dental no notable dental hx.    Pulmonary neg pulmonary ROS, former smoker,    Pulmonary exam normal        Cardiovascular Exercise Tolerance: Good hypertension, +CHF (ef 40%)  Normal cardiovascular exam+ dysrhythmias (aflutter) Atrial Fibrillation   echo nov 5797: MILD LV SYSTOLIC DYSFUNCTION  WITH MILD LVH NORMAL RIGHT VENTRICULAR SYSTOLIC FUNCTION MODERATE VALVULAR REGURGITATION NO VALVULAR STENOSIS MODERATE AR, MR MILD TR, PR EF 40%    cards note: nov 2020: [place]   Neuro/Psych  Headaches, Anxiety    GI/Hepatic negative GI ROS, Neg liver ROS,   Endo/Other  diabetes (diet)Morbid obesity (bmi 30)  Renal/GU CRFRenal disease (ckd3)  negative genitourinary   Musculoskeletal  (+) Arthritis ,   Abdominal   Peds  Hematology negative hematology ROS (+)   Anesthesia Other Findings Covid; NEG;  Last eliquis: 6/20   Reproductive/Obstetrics                            Anesthesia Physical Anesthesia Plan  ASA: III  Anesthesia Plan: Kavi   Post-op Pain Management:    Induction:   PONV Risk Score and Plan: 1 and TIVA  Airway Management Planned:   Additional Equipment:   Intra-op Plan:   Post-operative Plan:   Informed Consent: I have reviewed the patients History and Physical, chart, labs and discussed the procedure including the risks, benefits and alternatives for the proposed anesthesia with the patient or authorized representative who has indicated his/her understanding and acceptance.       Plan Discussed with: CRNA  Anesthesia Plan Comments:         Anesthesia Quick Evaluation

## 2019-10-28 ENCOUNTER — Other Ambulatory Visit: Payer: Self-pay

## 2019-10-28 ENCOUNTER — Other Ambulatory Visit
Admission: RE | Admit: 2019-10-28 | Discharge: 2019-10-28 | Disposition: A | Discharge status: Home / Self Care | Payer: Medicare Other | Source: Ambulatory Visit | Attending: Ophthalmology | Admitting: Ophthalmology

## 2019-10-28 DIAGNOSIS — Z20822 Contact with and (suspected) exposure to covid-19: Secondary | ICD-10-CM | POA: Insufficient documentation

## 2019-10-28 DIAGNOSIS — Z01812 Encounter for preprocedural laboratory examination: Secondary | ICD-10-CM | POA: Diagnosis present

## 2019-10-28 LAB — SARS CORONAVIRUS 2 (TAT 6-24 HRS): SARS Coronavirus 2: NEGATIVE

## 2019-10-28 NOTE — Discharge Instructions (Signed)

## 2019-11-01 ENCOUNTER — Ambulatory Visit: Payer: Medicare Other | Admitting: Anesthesiology

## 2019-11-01 ENCOUNTER — Ambulatory Visit
Admission: RE | Admit: 2019-11-01 | Discharge: 2019-11-01 | Disposition: A | Discharge status: Home / Self Care | Payer: Medicare Other | Attending: Ophthalmology | Admitting: Ophthalmology

## 2019-11-01 ENCOUNTER — Encounter: Payer: Self-pay | Admitting: Ophthalmology

## 2019-11-01 ENCOUNTER — Encounter
Admission: RE | Disposition: A | Discharge status: Home / Self Care | Payer: Self-pay | Source: Home / Self Care | Attending: Ophthalmology

## 2019-11-01 DIAGNOSIS — M1990 Unspecified osteoarthritis, unspecified site: Secondary | ICD-10-CM | POA: Diagnosis not present

## 2019-11-01 DIAGNOSIS — E1136 Type 2 diabetes mellitus with diabetic cataract: Secondary | ICD-10-CM | POA: Insufficient documentation

## 2019-11-01 DIAGNOSIS — Z7901 Long term (current) use of anticoagulants: Secondary | ICD-10-CM | POA: Diagnosis not present

## 2019-11-01 DIAGNOSIS — I10 Essential (primary) hypertension: Secondary | ICD-10-CM | POA: Insufficient documentation

## 2019-11-01 DIAGNOSIS — N183 Chronic kidney disease, stage 3 unspecified: Secondary | ICD-10-CM | POA: Diagnosis not present

## 2019-11-01 DIAGNOSIS — Z6830 Body mass index (BMI) 30.0-30.9, adult: Secondary | ICD-10-CM | POA: Insufficient documentation

## 2019-11-01 DIAGNOSIS — Z87891 Personal history of nicotine dependence: Secondary | ICD-10-CM | POA: Insufficient documentation

## 2019-11-01 DIAGNOSIS — E1122 Type 2 diabetes mellitus with diabetic chronic kidney disease: Secondary | ICD-10-CM | POA: Diagnosis not present

## 2019-11-01 DIAGNOSIS — I130 Hypertensive heart and chronic kidney disease with heart failure and stage 1 through stage 4 chronic kidney disease, or unspecified chronic kidney disease: Secondary | ICD-10-CM | POA: Diagnosis not present

## 2019-11-01 DIAGNOSIS — F419 Anxiety disorder, unspecified: Secondary | ICD-10-CM | POA: Diagnosis not present

## 2019-11-01 DIAGNOSIS — Z79899 Other long term (current) drug therapy: Secondary | ICD-10-CM | POA: Diagnosis not present

## 2019-11-01 DIAGNOSIS — H2511 Age-related nuclear cataract, right eye: Secondary | ICD-10-CM | POA: Diagnosis not present

## 2019-11-01 HISTORY — DX: Dyspnea, unspecified: R06.00

## 2019-11-01 HISTORY — DX: Headache, unspecified: R51.9

## 2019-11-01 HISTORY — DX: Pure hypercholesterolemia, unspecified: E78.00

## 2019-11-01 HISTORY — DX: Gout, unspecified: M10.9

## 2019-11-01 HISTORY — DX: Personal history of urinary calculi: Z87.442

## 2019-11-01 HISTORY — PX: CATARACT EXTRACTION W/PHACO: SHX586

## 2019-11-01 HISTORY — DX: Unspecified malignant neoplasm of skin, unspecified: C44.90

## 2019-11-01 HISTORY — DX: Unspecified hearing loss, unspecified ear: H91.90

## 2019-11-01 HISTORY — DX: Anxiety disorder, unspecified: F41.9

## 2019-11-01 HISTORY — DX: Motion sickness, initial encounter: T75.3XXA

## 2019-11-01 SURGERY — PHACOEMULSIFICATION, CATARACT, WITH IOL INSERTION
Anesthesia: Monitor Anesthesia Care | Site: Eye | Laterality: Right

## 2019-11-01 MED ORDER — SODIUM HYALURONATE 23 MG/ML IO SOLN
INTRAOCULAR | Status: DC | PRN
  Administered 2019-11-01: 0.6 mL via INTRAOCULAR

## 2019-11-01 MED ORDER — EPINEPHRINE PF 1 MG/ML IJ SOLN
INTRAOCULAR | Status: DC | PRN
  Administered 2019-11-01: 70 mL via OPHTHALMIC

## 2019-11-01 MED ORDER — OXYCODONE HCL 5 MG PO TABS: 5.0000 mg | ORAL_TABLET | Freq: Once | ORAL | Status: DC | PRN

## 2019-11-01 MED ORDER — MIDAZOLAM HCL 2 MG/2ML IJ SOLN
INTRAMUSCULAR | Status: DC | PRN
  Administered 2019-11-01: 1 mg via INTRAVENOUS

## 2019-11-01 MED ORDER — FENTANYL CITRATE (PF) 100 MCG/2ML IJ SOLN
INTRAMUSCULAR | Status: DC | PRN
  Administered 2019-11-01: 50 ug via INTRAVENOUS

## 2019-11-01 MED ORDER — SODIUM HYALURONATE 10 MG/ML IO SOLN
INTRAOCULAR | Status: DC | PRN
  Administered 2019-11-01: 0.55 mL via INTRAOCULAR

## 2019-11-01 MED ORDER — LIDOCAINE HCL (PF) 2 % IJ SOLN
INTRAOCULAR | Status: DC | PRN
  Administered 2019-11-01: 1 mL via INTRAOCULAR

## 2019-11-01 MED ORDER — TETRACAINE HCL 0.5 % OP SOLN
1.0000 [drp] | OPHTHALMIC | Status: DC | PRN
  Administered 2019-11-01 (×3): 1 [drp] via OPHTHALMIC

## 2019-11-01 MED ORDER — MOXIFLOXACIN HCL 0.5 % OP SOLN
OPHTHALMIC | Status: DC | PRN
  Administered 2019-11-01: 0.2 mL via OPHTHALMIC

## 2019-11-01 MED ORDER — OXYCODONE HCL 5 MG/5ML PO SOLN: 5.0000 mg | Freq: Once | ORAL | Status: DC | PRN

## 2019-11-01 MED ORDER — ARMC OPHTHALMIC DILATING DROPS
1.0000 "application " | OPHTHALMIC | Status: DC | PRN
  Administered 2019-11-01 (×3): 1 via OPHTHALMIC

## 2019-11-01 SURGICAL SUPPLY — 18 items
CANNULA ANT/CHMB 27GA (MISCELLANEOUS) ×6 IMPLANT
DISSECTOR HYDRO NUCLEUS 50X22 (MISCELLANEOUS) ×3 IMPLANT
GLOVE SURG LX 7.5 STRW (GLOVE) ×2
GLOVE SURG LX STRL 7.5 STRW (GLOVE) ×1 IMPLANT
GLOVE SURG SYN 8.5  E (GLOVE) ×2
GLOVE SURG SYN 8.5 E (GLOVE) ×1 IMPLANT
GOWN STRL REUS W/ TWL LRG LVL3 (GOWN DISPOSABLE) ×2 IMPLANT
GOWN STRL REUS W/TWL LRG LVL3 (GOWN DISPOSABLE) ×4
LENS IOL DIOP 17.0 (Intraocular Lens) ×3 IMPLANT
LENS IOL TECNIS MONO 17.0 (Intraocular Lens) ×1 IMPLANT
MARKER SKIN DUAL TIP RULER LAB (MISCELLANEOUS) ×3 IMPLANT
PACK DR. KING ARMS (PACKS) ×3 IMPLANT
PACK EYE AFTER SURG (MISCELLANEOUS) ×3 IMPLANT
PACK OPTHALMIC (MISCELLANEOUS) ×3 IMPLANT
SYR 3ML LL SCALE MARK (SYRINGE) ×3 IMPLANT
SYR TB 1ML LUER SLIP (SYRINGE) ×3 IMPLANT
WATER STERILE IRR 250ML POUR (IV SOLUTION) ×3 IMPLANT
WIPE NON LINTING 3.25X3.25 (MISCELLANEOUS) ×3 IMPLANT

## 2019-11-01 NOTE — H&P (Signed)

## 2019-11-01 NOTE — Op Note (Signed)
OPERATIVE NOTE  GORDAN GRELL [id number] 11/01/2019   PREOPERATIVE DIAGNOSIS:  Nuclear sclerotic cataract right eye.  H25.11   POSTOPERATIVE DIAGNOSIS:    Nuclear sclerotic cataract right eye.     PROCEDURE:  Phacoemusification with posterior chamber intraocular lens placement of the right eye   LENS:   Implant Name Type Inv. Item Serial No. Manufacturer Lot No. LRB No. Used Action  LENS IOL DIOP 17.0 - [id number] Intraocular Lens LENS IOL DIOP 17.0 [id number] [employer]  Right 1 Implanted       Procedure(s): CATARACT EXTRACTION PHACO AND INTRAOCULAR LENS PLACEMENT (IOC) RIGHT DIABETIC 6.31  00:44.8 (Right)  DCB00 +17.0   ULTRASOUND TIME: 0 minutes 44 seconds.  CDE 6.31   SURGEON:  Benay Pillow, MD, MPH  ANESTHESIOLOGIST: Anesthesiologist: Fidel Levy, MD CRNA: Cameron Ali, CRNA   ANESTHESIA:  Topical with tetracaine drops augmented with 1% preservative-free intracameral lidocaine.  ESTIMATED BLOOD LOSS: less than 1 mL.   COMPLICATIONS:  None.   DESCRIPTION OF PROCEDURE:  The patient was identified in the holding room and transported to the operating room and placed in the supine position under the operating microscope.  The right eye was identified as the operative eye and it was prepped and draped in the usual sterile ophthalmic fashion.   A 1.0 millimeter clear-corneal paracentesis was made at the 10:30 position. 0.5 ml of preservative-free 1% lidocaine with epinephrine was injected into the anterior chamber.  The anterior chamber was filled with Healon 5 viscoelastic.  A 2.4 millimeter keratome was used to make a near-clear corneal incision at the 8:00 position.  A curvilinear capsulorrhexis was made with a cystotome and capsulorrhexis forceps.  Balanced salt solution was used to hydrodissect and hydrodelineate the nucleus.   Phacoemulsification was then used in stop and chop fashion to remove the lens nucleus and epinucleus.  The remaining cortex was  then removed using the irrigation and aspiration handpiece. Healon was then placed into the capsular bag to distend it for lens placement.  A lens was then injected into the capsular bag.  The remaining viscoelastic was aspirated.   Wounds were hydrated with balanced salt solution.  The anterior chamber was inflated to a physiologic pressure with balanced salt solution.   Intracameral vigamox 0.1 mL undiluted was injected into the eye and a drop placed onto the ocular surface.  No wound leaks were noted.  The patient was taken to the recovery room in stable condition without complications of anesthesia or surgery  Benay Pillow 11/01/2019, 8:34 AM

## 2019-11-01 NOTE — Transfer of Care (Signed)
Immediate Anesthesia Transfer of Care Note  Patient: Matthew Ali  Procedure(s) Performed: CATARACT EXTRACTION PHACO AND INTRAOCULAR LENS PLACEMENT (IOC) RIGHT DIABETIC 6.31  00:44.8 (Right Eye)  Patient Location: PACU  Anesthesia Type: Jamoni  Level of Consciousness: awake, alert  and patient cooperative  Airway and Oxygen Therapy: Patient Spontanous Breathing and Patient connected to supplemental oxygen  Post-op Assessment: Post-op Vital signs reviewed, Patient's Cardiovascular Status Stable, Respiratory Function Stable, Patent Airway and No signs of Nausea or vomiting  Post-op Vital Signs: Reviewed and stable  Complications: No complications documented.

## 2019-11-01 NOTE — Anesthesia Postprocedure Evaluation (Signed)
Anesthesia Post Note  Patient: Matthew Ali  Procedure(s) Performed: CATARACT EXTRACTION PHACO AND INTRAOCULAR LENS PLACEMENT (IOC) RIGHT DIABETIC 6.31  00:44.8 (Right Eye)     Patient location during evaluation: PACU Anesthesia Type: Delonta Level of consciousness: awake and alert Pain management: pain level controlled Vital Signs Assessment: post-procedure vital signs reviewed and stable Respiratory status: spontaneous breathing, nonlabored ventilation, respiratory function stable and patient connected to nasal cannula oxygen Cardiovascular status: stable and blood pressure returned to baseline Postop Assessment: no apparent nausea or vomiting Anesthetic complications: no   No complications documented.  Fidel Levy

## 2019-11-01 NOTE — Anesthesia Procedure Notes (Signed)
Procedure Name: Rhiley Date/Time: 11/01/2019 8:10 AM Performed by: Cameron Ali, CRNA Pre-anesthesia Checklist: Patient identified, Emergency Drugs available, Suction available, Timeout performed and Patient being monitored Patient Re-evaluated:Patient Re-evaluated prior to induction Oxygen Delivery Method: Nasal cannula Placement Confirmation: positive ETCO2

## 2019-11-02 ENCOUNTER — Encounter: Payer: Self-pay | Admitting: Ophthalmology

## 2019-11-18 ENCOUNTER — Encounter: Payer: Self-pay | Admitting: Ophthalmology

## 2019-11-26 NOTE — Discharge Instructions (Signed)
General Anesthesia, Adult, Care After This sheet gives you information about how to care for yourself after your procedure. Your health care provider may also give you more specific instructions. If you have problems or questions, contact your health care provider. What can I expect after the procedure? After the procedure, the following side effects are common:  Pain or discomfort at the IV site.  Nausea.  Vomiting.  Sore throat.  Trouble concentrating.  Feeling cold or chills.  Weak or tired.  Sleepiness and fatigue.  Soreness and body aches. These side effects can affect parts of the body that were not involved in surgery. Follow these instructions at home:  For at least 24 hours after the procedure:  Have a responsible adult stay with you. It is important to have someone help care for you until you are awake and alert.  Rest as needed.  Do not: ? Participate in activities in which you could fall or become injured. ? Drive. ? Use heavy machinery. ? Drink alcohol. ? Take sleeping pills or medicines that cause drowsiness. ? Make important decisions or sign legal documents. ? Take care of children on your own. Eating and drinking  Follow any instructions from your health care provider about eating or drinking restrictions.  When you feel hungry, start by eating small amounts of foods that are soft and easy to digest (bland), such as toast. Gradually return to your regular diet.  Drink enough fluid to keep your urine pale yellow.  If you vomit, rehydrate by drinking water, juice, or clear broth. General instructions  If you have sleep apnea, surgery and certain medicines can increase your risk for breathing problems. Follow instructions from your health care provider about wearing your sleep device: ? Anytime you are sleeping, including during daytime naps. ? While taking prescription pain medicines, sleeping medicines, or medicines that make you drowsy.  Return to  your normal activities as told by your health care provider. Ask your health care provider what activities are safe for you.  Take over-the-counter and prescription medicines only as told by your health care provider.  If you smoke, do not smoke without supervision.  Keep all follow-up visits as told by your health care provider. This is important. Contact a health care provider if:  You have nausea or vomiting that does not get better with medicine.  You cannot eat or drink without vomiting.  You have pain that does not get better with medicine.  You are unable to pass urine.  You develop a skin rash.  You have a fever.  You have redness around your IV site that gets worse. Get help right away if:  You have difficulty breathing.  You have chest pain.  You have blood in your urine or stool, or you vomit blood. Summary  After the procedure, it is common to have a sore throat or nausea. It is also common to feel tired.  Have a responsible adult stay with you for the first 24 hours after general anesthesia. It is important to have someone help care for you until you are awake and alert.  When you feel hungry, start by eating small amounts of foods that are soft and easy to digest (bland), such as toast. Gradually return to your regular diet.  Drink enough fluid to keep your urine pale yellow.  Return to your normal activities as told by your health care provider. Ask your health care provider what activities are safe for you. This information is not   intended to replace advice given to you by your health care provider. Make sure you discuss any questions you have with your health care provider. Document Revised: 05/02/2017 Document Reviewed: 12/13/2016 Elsevier Patient Education  2020 [employer]  

## 2019-11-29 ENCOUNTER — Ambulatory Visit: Payer: Medicare Other | Admitting: Anesthesiology

## 2019-11-29 ENCOUNTER — Other Ambulatory Visit: Payer: Self-pay

## 2019-11-29 ENCOUNTER — Encounter: Payer: Self-pay | Admitting: Ophthalmology

## 2019-11-29 ENCOUNTER — Ambulatory Visit
Admission: RE | Admit: 2019-11-29 | Discharge: 2019-11-29 | Disposition: A | Discharge status: Home / Self Care | Payer: Medicare Other | Attending: Ophthalmology | Admitting: Ophthalmology

## 2019-11-29 ENCOUNTER — Encounter
Admission: RE | Disposition: A | Discharge status: Home / Self Care | Payer: Self-pay | Source: Home / Self Care | Attending: Ophthalmology

## 2019-11-29 DIAGNOSIS — F419 Anxiety disorder, unspecified: Secondary | ICD-10-CM | POA: Insufficient documentation

## 2019-11-29 DIAGNOSIS — Z87891 Personal history of nicotine dependence: Secondary | ICD-10-CM | POA: Insufficient documentation

## 2019-11-29 DIAGNOSIS — E7800 Pure hypercholesterolemia, unspecified: Secondary | ICD-10-CM | POA: Diagnosis not present

## 2019-11-29 DIAGNOSIS — H9190 Unspecified hearing loss, unspecified ear: Secondary | ICD-10-CM | POA: Insufficient documentation

## 2019-11-29 DIAGNOSIS — E1136 Type 2 diabetes mellitus with diabetic cataract: Secondary | ICD-10-CM | POA: Insufficient documentation

## 2019-11-29 DIAGNOSIS — M109 Gout, unspecified: Secondary | ICD-10-CM | POA: Diagnosis not present

## 2019-11-29 DIAGNOSIS — I4891 Unspecified atrial fibrillation: Secondary | ICD-10-CM | POA: Diagnosis not present

## 2019-11-29 DIAGNOSIS — I509 Heart failure, unspecified: Secondary | ICD-10-CM | POA: Insufficient documentation

## 2019-11-29 DIAGNOSIS — Z89022 Acquired absence of left finger(s): Secondary | ICD-10-CM | POA: Insufficient documentation

## 2019-11-29 DIAGNOSIS — E1122 Type 2 diabetes mellitus with diabetic chronic kidney disease: Secondary | ICD-10-CM | POA: Diagnosis not present

## 2019-11-29 DIAGNOSIS — H2512 Age-related nuclear cataract, left eye: Secondary | ICD-10-CM | POA: Diagnosis not present

## 2019-11-29 DIAGNOSIS — I130 Hypertensive heart and chronic kidney disease with heart failure and stage 1 through stage 4 chronic kidney disease, or unspecified chronic kidney disease: Secondary | ICD-10-CM | POA: Diagnosis not present

## 2019-11-29 DIAGNOSIS — Z7901 Long term (current) use of anticoagulants: Secondary | ICD-10-CM | POA: Diagnosis not present

## 2019-11-29 DIAGNOSIS — N183 Chronic kidney disease, stage 3 unspecified: Secondary | ICD-10-CM | POA: Insufficient documentation

## 2019-11-29 DIAGNOSIS — Z85828 Personal history of other malignant neoplasm of skin: Secondary | ICD-10-CM | POA: Insufficient documentation

## 2019-11-29 DIAGNOSIS — Z79899 Other long term (current) drug therapy: Secondary | ICD-10-CM | POA: Insufficient documentation

## 2019-11-29 HISTORY — PX: CATARACT EXTRACTION W/PHACO: SHX586

## 2019-11-29 SURGERY — PHACOEMULSIFICATION, CATARACT, WITH IOL INSERTION
Anesthesia: Monitor Anesthesia Care | Site: Eye | Laterality: Left

## 2019-11-29 MED ORDER — EPINEPHRINE PF 1 MG/ML IJ SOLN
INTRAOCULAR | Status: DC | PRN
  Administered 2019-11-29: 74 mL via OPHTHALMIC

## 2019-11-29 MED ORDER — LIDOCAINE HCL (PF) 2 % IJ SOLN: INTRAOCULAR | Status: DC | PRN

## 2019-11-29 MED ORDER — MOXIFLOXACIN HCL 0.5 % OP SOLN
OPHTHALMIC | Status: DC | PRN
  Administered 2019-11-29: 0.2 mL via OPHTHALMIC

## 2019-11-29 MED ORDER — SODIUM HYALURONATE 23 MG/ML IO SOLN
INTRAOCULAR | Status: DC | PRN
  Administered 2019-11-29: 0.6 mL via INTRAOCULAR

## 2019-11-29 MED ORDER — MIDAZOLAM HCL 2 MG/2ML IJ SOLN
INTRAMUSCULAR | Status: DC | PRN
  Administered 2019-11-29: 1 mg via INTRAVENOUS

## 2019-11-29 MED ORDER — TETRACAINE HCL 0.5 % OP SOLN
1.0000 [drp] | OPHTHALMIC | Status: DC | PRN
  Administered 2019-11-29 (×3): 1 [drp] via OPHTHALMIC

## 2019-11-29 MED ORDER — ARMC OPHTHALMIC DILATING DROPS
1.0000 "application " | OPHTHALMIC | Status: DC | PRN
  Administered 2019-11-29 (×3): 1 via OPHTHALMIC

## 2019-11-29 MED ORDER — FENTANYL CITRATE (PF) 100 MCG/2ML IJ SOLN
INTRAMUSCULAR | Status: DC | PRN
  Administered 2019-11-29: 50 ug via INTRAVENOUS

## 2019-11-29 MED ORDER — LACTATED RINGERS IV SOLN: INTRAVENOUS | Status: DC

## 2019-11-29 MED ORDER — SODIUM HYALURONATE 10 MG/ML IO SOLN
INTRAOCULAR | Status: DC | PRN
  Administered 2019-11-29: 0.55 mL via INTRAOCULAR

## 2019-11-29 SURGICAL SUPPLY — 20 items
CANNULA ANT/CHMB 27G (MISCELLANEOUS) ×2 IMPLANT
CANNULA ANT/CHMB 27GA (MISCELLANEOUS) ×6 IMPLANT
DISSECTOR HYDRO NUCLEUS 50X22 (MISCELLANEOUS) ×3 IMPLANT
GLOVE SURG LX 7.5 STRW (GLOVE) ×4
GLOVE SURG LX STRL 7.5 STRW (GLOVE) ×1 IMPLANT
GLOVE SURG SYN 8.5  E (GLOVE) ×2
GLOVE SURG SYN 8.5 E (GLOVE) ×1 IMPLANT
GLOVE SURG SYN 8.5 PF PI (GLOVE) ×1 IMPLANT
GOWN STRL REUS W/ TWL LRG LVL3 (GOWN DISPOSABLE) ×2 IMPLANT
GOWN STRL REUS W/TWL LRG LVL3 (GOWN DISPOSABLE) ×4
LENS IOL DIOP 17.5 (Intraocular Lens) ×3 IMPLANT
LENS IOL TECNIS MONO 17.5 (Intraocular Lens) IMPLANT
MARKER SKIN DUAL TIP RULER LAB (MISCELLANEOUS) ×3 IMPLANT
PACK DR. KING ARMS (PACKS) ×3 IMPLANT
PACK EYE AFTER SURG (MISCELLANEOUS) ×3 IMPLANT
PACK OPTHALMIC (MISCELLANEOUS) ×3 IMPLANT
SYR 3ML LL SCALE MARK (SYRINGE) ×3 IMPLANT
SYR TB 1ML LUER SLIP (SYRINGE) ×3 IMPLANT
WATER STERILE IRR 250ML POUR (IV SOLUTION) ×3 IMPLANT
WIPE NON LINTING 3.25X3.25 (MISCELLANEOUS) ×3 IMPLANT

## 2019-11-29 NOTE — Anesthesia Procedure Notes (Signed)
Procedure Name: Jahmar Date/Time: 11/29/2019 11:37 AM Performed by: Silvana Newness, CRNA Pre-anesthesia Checklist: Patient identified, Emergency Drugs available, Suction available, Patient being monitored and Timeout performed Patient Re-evaluated:Patient Re-evaluated prior to induction Oxygen Delivery Method: Nasal cannula

## 2019-11-29 NOTE — H&P (Signed)

## 2019-11-29 NOTE — Op Note (Signed)
OPERATIVE NOTE  Matthew Ali [id number] 11/29/2019   PREOPERATIVE DIAGNOSIS:  Nuclear sclerotic cataract left eye.  H25.12   POSTOPERATIVE DIAGNOSIS:    Nuclear sclerotic cataract left eye.     PROCEDURE:  Phacoemusification with posterior chamber intraocular lens placement of the left eye   LENS:   Implant Name Type Inv. Item Serial No. Manufacturer Lot No. LRB No. Used Action  LENS IOL DIOP 17.5 - [id number] Intraocular Lens LENS IOL DIOP 17.5 [id number] [employer]  Left 1 Implanted      Procedure(s) with comments: CATARACT EXTRACTION PHACO AND INTRAOCULAR LENS PLACEMENT (IOC) LEFT DIABETIC (Left) - 4.03 0:35.0  DCB00 +17.5   ULTRASOUND TIME: 0 minutes 35 seconds.  CDE 4.03   SURGEON:  Benay Pillow, MD, MPH   ANESTHESIA:  Topical with tetracaine drops augmented with 1% preservative-free intracameral lidocaine.  ESTIMATED BLOOD LOSS: <1 mL   COMPLICATIONS:  None.   DESCRIPTION OF PROCEDURE:  The patient was identified in the holding room and transported to the operating room and placed in the supine position under the operating microscope.  The left eye was identified as the operative eye and it was prepped and draped in the usual sterile ophthalmic fashion.   A 1.0 millimeter clear-corneal paracentesis was made at the 5:00 position. 0.5 ml of preservative-free 1% lidocaine with epinephrine was injected into the anterior chamber.  The anterior chamber was filled with Healon 5 viscoelastic.  A 2.4 millimeter keratome was used to make a near-clear corneal incision at the 2:00 position.  A curvilinear capsulorrhexis was made with a cystotome and capsulorrhexis forceps.  Balanced salt solution was used to hydrodissect and hydrodelineate the nucleus.   Phacoemulsification was then used in stop and chop fashion to remove the lens nucleus and epinucleus.  The remaining cortex was then removed using the irrigation and aspiration handpiece. Healon was then placed into the  capsular bag to distend it for lens placement.  A lens was then injected into the capsular bag.  The remaining viscoelastic was aspirated.   Wounds were hydrated with balanced salt solution.  The anterior chamber was inflated to a physiologic pressure with balanced salt solution.  Intracameral vigamox 0.1 mL undiltued was injected into the eye and a drop placed onto the ocular surface.  No wound leaks were noted.  The patient was taken to the recovery room in stable condition without complications of anesthesia or surgery  Benay Pillow 11/29/2019, 11:53 AM

## 2019-11-29 NOTE — Transfer of Care (Signed)
Immediate Anesthesia Transfer of Care Note  Patient: Matthew Ali  Procedure(s) Performed: CATARACT EXTRACTION PHACO AND INTRAOCULAR LENS PLACEMENT (IOC) LEFT DIABETIC (Left Eye)  Patient Location: PACU  Anesthesia Type: Tucker  Level of Consciousness: awake, alert  and patient cooperative  Airway and Oxygen Therapy: Patient Spontanous Breathing and Patient connected to supplemental oxygen  Post-op Assessment: Post-op Vital signs reviewed, Patient's Cardiovascular Status Stable, Respiratory Function Stable, Patent Airway and No signs of Nausea or vomiting  Post-op Vital Signs: Reviewed and stable  Complications: No complications documented.

## 2019-11-29 NOTE — Anesthesia Preprocedure Evaluation (Addendum)
Anesthesia Evaluation  Patient identified by MRN, date of birth, ID band Patient awake    Reviewed: Allergy & Precautions, NPO status , Patient's Chart, lab work & pertinent test results, reviewed documented beta blocker date and time   History of Anesthesia Complications Negative for: history of anesthetic complications  Airway Mallampati: II  TM Distance: >3 FB Neck ROM: full    Dental no notable dental hx.    Pulmonary former smoker,    Pulmonary exam normal breath sounds clear to auscultation       Cardiovascular Exercise Tolerance: Good hypertension, (-) angina+CHF (ef 40%)  (-) DOE Normal cardiovascular exam+ dysrhythmias (aflutter) Atrial Fibrillation  Rhythm:Regular Rate:Normal  echo nov 1751: MILD LV SYSTOLIC DYSFUNCTION  WITH MILD LVH NORMAL RIGHT VENTRICULAR SYSTOLIC FUNCTION MODERATE VALVULAR REGURGITATION NO VALVULAR STENOSIS MODERATE AR, MR MILD TR, PR EF 40%    cards note: nov 2020: [place]   Neuro/Psych  Headaches, Anxiety    GI/Hepatic neg GERD  ,  Endo/Other  diabetesMorbid obesity (bmi 30)  Renal/GU CRFRenal disease (ckd3, stones)     Musculoskeletal  (+) Arthritis ,   Abdominal (+) + obese (BMI 30),   Peds  Hematology   Anesthesia Other Findings Gout Skin cancer  Reproductive/Obstetrics                            Anesthesia Physical  Anesthesia Plan  ASA: III  Anesthesia Plan: Elijan   Post-op Pain Management:    Induction: Intravenous  PONV Risk Score and Plan: 1 and TIVA  Airway Management Planned: Nasal Cannula  Additional Equipment:   Intra-op Plan:   Post-operative Plan:   Informed Consent: I have reviewed the patients History and Physical, chart, labs and discussed the procedure including the risks, benefits and alternatives for the proposed anesthesia with the patient or authorized representative who has indicated his/her understanding and  acceptance.       Plan Discussed with: CRNA  Anesthesia Plan Comments:         Anesthesia Quick Evaluation

## 2019-11-29 NOTE — Anesthesia Postprocedure Evaluation (Signed)
Anesthesia Post Note  Patient: Matthew Ali  Procedure(s) Performed: CATARACT EXTRACTION PHACO AND INTRAOCULAR LENS PLACEMENT (IOC) LEFT DIABETIC (Left Eye)     Patient location during evaluation: PACU Anesthesia Type: Erman Level of consciousness: awake and alert Pain management: pain level controlled Vital Signs Assessment: post-procedure vital signs reviewed and stable Respiratory status: spontaneous breathing, nonlabored ventilation, respiratory function stable and patient connected to nasal cannula oxygen Cardiovascular status: stable and blood pressure returned to baseline Postop Assessment: no apparent nausea or vomiting Anesthetic complications: no   No complications documented.  Aahan Marques A  Harrison Paulson

## 2019-11-30 ENCOUNTER — Encounter: Payer: Self-pay | Admitting: Ophthalmology
